# Patient Record
Sex: Female | Born: 1980 | Race: White | Hispanic: No | Smoking: Never smoker
Health system: Southern US, Community
[De-identification: ages and names within clinical notes are randomized; demographics above are authoritative.]

## PROBLEM LIST (undated history)

## (undated) DIAGNOSIS — N3010 Interstitial cystitis (chronic) without hematuria: Secondary | ICD-10-CM

## (undated) DIAGNOSIS — F329 Major depressive disorder, single episode, unspecified: Secondary | ICD-10-CM

## (undated) DIAGNOSIS — G4700 Insomnia, unspecified: Secondary | ICD-10-CM

## (undated) DIAGNOSIS — F32A Depression, unspecified: Secondary | ICD-10-CM

## (undated) HISTORY — DX: Interstitial cystitis (chronic) without hematuria: N30.10

## (undated) HISTORY — DX: Insomnia, unspecified: G47.00

## (undated) HISTORY — DX: Depression, unspecified: F32.A

## (undated) HISTORY — DX: Major depressive disorder, single episode, unspecified: F32.9

---

## 2003-12-30 ENCOUNTER — Inpatient Hospital Stay (HOSPITAL_COMMUNITY): Admission: AD | Admit: 2003-12-30 | Discharge: 2003-12-30 | Payer: Self-pay | Admitting: Family Medicine

## 2004-09-25 ENCOUNTER — Emergency Department (HOSPITAL_COMMUNITY): Admission: EM | Admit: 2004-09-25 | Discharge: 2004-09-25 | Payer: Self-pay | Admitting: Emergency Medicine

## 2005-11-26 ENCOUNTER — Emergency Department (HOSPITAL_COMMUNITY): Admission: EM | Admit: 2005-11-26 | Discharge: 2005-11-26 | Payer: Self-pay | Admitting: Emergency Medicine

## 2006-04-29 ENCOUNTER — Emergency Department (HOSPITAL_COMMUNITY): Admission: EM | Admit: 2006-04-29 | Discharge: 2006-04-29 | Payer: Self-pay | Admitting: Family Medicine

## 2006-05-14 ENCOUNTER — Emergency Department (HOSPITAL_COMMUNITY): Admission: EM | Admit: 2006-05-14 | Discharge: 2006-05-14 | Payer: Self-pay | Admitting: Emergency Medicine

## 2007-01-26 ENCOUNTER — Emergency Department (HOSPITAL_COMMUNITY): Admission: EM | Admit: 2007-01-26 | Discharge: 2007-01-26 | Payer: Self-pay | Admitting: Emergency Medicine

## 2007-05-15 ENCOUNTER — Emergency Department (HOSPITAL_COMMUNITY): Admission: EM | Admit: 2007-05-15 | Discharge: 2007-05-15 | Payer: Self-pay | Admitting: Family Medicine

## 2009-08-31 ENCOUNTER — Ambulatory Visit (HOSPITAL_BASED_OUTPATIENT_CLINIC_OR_DEPARTMENT_OTHER): Admission: RE | Admit: 2009-08-31 | Discharge: 2009-08-31 | Payer: Self-pay

## 2009-09-03 ENCOUNTER — Ambulatory Visit: Payer: Self-pay | Admitting: Internal Medicine

## 2010-10-02 ENCOUNTER — Ambulatory Visit (INDEPENDENT_AMBULATORY_CARE_PROVIDER_SITE_OTHER): Payer: BC Managed Care – PPO | Admitting: Licensed Clinical Social Worker

## 2010-10-02 DIAGNOSIS — F4323 Adjustment disorder with mixed anxiety and depressed mood: Secondary | ICD-10-CM

## 2010-10-17 ENCOUNTER — Ambulatory Visit: Payer: BC Managed Care – PPO | Admitting: Licensed Clinical Social Worker

## 2010-11-21 ENCOUNTER — Ambulatory Visit (INDEPENDENT_AMBULATORY_CARE_PROVIDER_SITE_OTHER): Payer: BC Managed Care – PPO | Admitting: Licensed Clinical Social Worker

## 2010-11-21 DIAGNOSIS — F4323 Adjustment disorder with mixed anxiety and depressed mood: Secondary | ICD-10-CM

## 2010-12-20 ENCOUNTER — Ambulatory Visit: Payer: BC Managed Care – PPO | Admitting: Licensed Clinical Social Worker

## 2011-01-24 ENCOUNTER — Ambulatory Visit (INDEPENDENT_AMBULATORY_CARE_PROVIDER_SITE_OTHER): Payer: BC Managed Care – PPO | Admitting: Family Medicine

## 2011-01-24 ENCOUNTER — Encounter: Payer: Self-pay | Admitting: Family Medicine

## 2011-01-24 VITALS — BP 130/90 | HR 91 | Temp 98.4°F | Ht 64.0 in | Wt 199.5 lb

## 2011-01-24 DIAGNOSIS — F3289 Other specified depressive episodes: Secondary | ICD-10-CM

## 2011-01-24 DIAGNOSIS — N3010 Interstitial cystitis (chronic) without hematuria: Secondary | ICD-10-CM | POA: Insufficient documentation

## 2011-01-24 DIAGNOSIS — G4700 Insomnia, unspecified: Secondary | ICD-10-CM

## 2011-01-24 DIAGNOSIS — Z113 Encounter for screening for infections with a predominantly sexual mode of transmission: Secondary | ICD-10-CM

## 2011-01-24 DIAGNOSIS — G4730 Sleep apnea, unspecified: Secondary | ICD-10-CM | POA: Insufficient documentation

## 2011-01-24 DIAGNOSIS — F32A Depression, unspecified: Secondary | ICD-10-CM | POA: Insufficient documentation

## 2011-01-24 DIAGNOSIS — F329 Major depressive disorder, single episode, unspecified: Secondary | ICD-10-CM | POA: Insufficient documentation

## 2011-01-24 MED ORDER — BUPROPION HCL ER (XL) 150 MG PO TB24: 150.0000 mg | ORAL_TABLET | ORAL | Status: DC

## 2011-01-24 NOTE — Patient Instructions (Signed)
Wonderful to meet you. Please call me in 3-4 weeks to let me know how you're feeling.  IMPORTANT: HOW TO USE THIS INFORMATION:  This is a summary and does NOT have all possible information about this product. This information does not assure that this product is safe, effective, or appropriate for you. This information is not individual medical advice and does not substitute for the advice of your health care professional. Always ask your health care professional for complete information about this product and your specific health needs.    BUPROPION - ORAL ([id number]-on)    COMMON BRAND NAME(S): Wellbutrin    WARNING:  Bupropion is an antidepressant used to treat a variety of conditions, including depression, other mental/mood disorders, and smoking cessation. Antidepressants can help prevent suicidal thoughts/attempts and provide other important benefits. However, studies have shown that a small number of people who take antidepressants for any condition may experience new or worsening depression, other mental/mood symptoms, or suicidal thoughts/attempts. Therefore, it is very important to talk with the doctor about the risks and benefits of antidepressant medication, even if treatment is not for a mental/mood condition. Tell the doctor immediately if you notice new or worsening depression/other psychiatric conditions, unusual behavior changes (including possible suicidal thoughts/attempts), or other mental/mood changes (including new/worsening anxiety, panic attacks, trouble sleeping, irritability, hostile/angry feelings, impulsive actions, severe restlessness, very rapid speech). Be especially watchful for these symptoms when a new antidepressant is started or when the dose is changed. If you are using bupropion to quit smoking, stop taking bupropion and contact your doctor immediately if you experience any of the symptoms listed above or if you have any of these symptoms after stopping treatment.    USES:  Bupropion is used to treat depression. It can improve your mood and feelings of well-being. It may work by helping to restore the balance of certain natural chemicals (neurotransmitters) in your brain.    OTHER USES:  This section contains uses of this drug that are not listed in the approved professional labeling for the drug but that may be prescribed by your health care professional. Use this drug for a condition that is listed in this section only if it has been so prescribed by your health care professional. Bupropion may also be used to treat attention deficit hyperactivity disorder (ADHD), or to help people quit smoking by decreasing cravings and nicotine withdrawal effects. This drug may also be used with other medications to treat bipolar disorder (depressive phase).    HOW TO USE:  Read the Patient Information Leaflet and Medication Guide available from your pharmacist before you start using bupropion and each time you get a refill. Consult your doctor or pharmacist if you have any questions. Take this medication by mouth, with or without food, usually three times daily. If stomach upset occurs, you may take this drug with food. It is important to take your doses at least 6 hours apart or as directed by your doctor to decrease your risk of having a seizure. Do not take more or less medication or take it more frequently than prescribed. Taking more than the recommended dose of bupropion may increase your risk of having a seizure. Do not take more than 150 milligrams as a single dose, and do not take more than 450 milligrams per day. Your dosage is based on your medical condition and response to therapy. Your dose may be slowly increased to limit side effects such as sleeplessness, and to decrease the risk of seizures. To  avoid trouble sleeping, do not take this medication too close to bedtime. Let your doctor know if sleeplessness becomes a problem. Use this medication regularly in order to get  the most benefit from it. To help you remember, use it at the same times each day. Do not stop taking this medication without consulting your doctor. Some conditions may become worse when the drug is suddenly stopped. Your dose may need to be gradually decreased. It may take 4 or more weeks before you notice the full benefit of this drug. Continue to take this medication as directed by your doctor even after you feel better. Talk to your doctor if your condition does not improve or if it worsens.    SIDE EFFECTS:  See also the How to Use, Precautions, and Warning sections. Nausea, vomiting, dry mouth, headache, constipation, increased sweating, joint aches, sore throat, blurred vision, strange taste in the mouth, or dizziness may occur. If any of these effects persist or worsen, notify your doctor or pharmacist promptly. Remember that your doctor has prescribed this medication because he or she has judged that the benefit to you is greater than the risk of side effects. Many people using this medication do not have serious side effects. Tell your doctor immediately if any of these unlikely but serious side effects occur: chest pain, fainting, fast/pounding/irregular heartbeat, hearing problems, ringing in the ears, severe headache, mental/mood changes (e.g., agitation, anxiety, confusion, hallucinations), uncontrolled movements (tremor), unusual weight loss or gain. Tell your doctor immediately if any of these rare but very serious side effects occur: muscle pain/tenderness/weakness, change in the amount of urine. This drug may infrequently cause seizures. Seek immediate medical attention if you experience a seizure. If you have a seizure while taking bupropion, you should not take this drug again. A very serious allergic reaction to this drug is unlikely, but seek immediate medical attention if it occurs. Symptoms of a serious allergic reaction include: rash, itching/swelling (especially of the  face/tongue/throat), severe dizziness, trouble breathing. This is not a complete list of possible side effects. If you notice other effects not listed above, contact your doctor or pharmacist. In the [place] - Call your doctor for medical advice about side effects. You may report side effects to FDA at 1-800-FDA-1088. In [place] - Call your doctor for medical advice about side effects. You may report side effects to [facility] [place] at [phone].    PRECAUTIONS:  See also the How to Use and Warning sections. Before taking bupropion, tell your doctor or pharmacist if you are allergic to it; or if you have any other allergies. This product may contain inactive ingredients, which can cause allergic reactions or other problems. Talk to your pharmacist for more details. This medication should not be used if you have certain medical conditions. Before using this medicine, consult your doctor or pharmacist if you have or have had: seizures, eating disorders (e.g., bulimia, anorexia nervosa). This medication should not be used if you are suddenly stopping regular use of sedatives (e.g., benzodiazepines such as lorazepam) or alcohol. Doing so may increase your risk of seizures. Large amounts of alcohol may also increase your risk of seizures and dizziness. Discuss your use of these products with your doctor. Before using this medication, tell your doctor or pharmacist your medical history, especially of: alcohol/drug dependence (including benzodiazepines, narcotic pain medicines, cocaine and stimulants), brain tumor, diabetes, head injury, heart disease (e.g., congestive heart failure, high blood pressure, recent heart attack), kidney problems, liver problems (e.g., cirrhosis), personal  or family history of psychiatric disorder (e.g., bipolar/manic-depressive disorder), personal or family history of suicide thoughts/attempts, intent to quit smoking. Though uncommon, depression can lead to thoughts or attempts of suicide. Tell  your doctor immediately if you have any suicidal thoughts, worsening depression, or any other mental/mood changes (including new or worsening anxiety, agitation, panic attacks, trouble sleeping, irritability, hostile/angry feelings, impulsive actions, severe restlessness, rapid speech, unusual behavior changes). Keep all medical appointments so your doctor can monitor your progress closely and adjust or change your medication if needed. This drug may make you dizzy or affect your coordination. Do not drive, use machinery, or do any activity that requires alertness until you are sure you can perform such activities safely. Avoid or limit alcoholic beverages. Kidney function declines as you grow older. This medication is removed by the kidneys. Therefore, elderly people may be more sensitive to this drug and to side effects. This medication should be used only when clearly needed during pregnancy. Discuss the risks and benefits with your doctor. Infrequently, newborns whose mothers have used certain newer antidepressants during the last 3 months of pregnancy may develop symptoms including persistent feeding or breathing difficulties, jitteriness, seizures or constant crying. Promptly report any such symptoms to the doctor. However, do not stop taking this medication unless your doctor directs you to do so. This drug passes into breast milk and may have undesirable effects on a nursing infant. Consult your doctor before breast-feeding.    DRUG INTERACTIONS:  The effects of some drugs can change if you take other drugs or herbal products at the same time. This can increase your risk for serious side effects or may cause your medications not to work correctly. These drug interactions are possible, but do not always occur. Your doctor or pharmacist can often prevent or manage interactions by changing how you use your medications or by close monitoring. To help your doctor and pharmacist give you the best care, be sure  to tell your doctor and pharmacist about all the products you use (including prescription drugs, nonprescription drugs, and herbal products) before starting treatment with this product. While using this product, do not start, stop, or change the dosage of any other medicines you are using without your doctor's approval. Some products that may interact with this drug include: amantadine, diabetes medications (such as glyburide, glipizide, or insulin), certain x-ray dyes (including iomeprol), levodopa, nicotine products (such as patches, gum, or spray), regular use of sedatives (such as alprazolam), stimulants, warfarin. Avoid taking MAO inhibitors (isocarboxazid, linezolid, moclobemide, phenelzine, procarbazine, rasagiline, selegiline, tranylcypromine) during treatment with this medication and for two weeks before and after treatment. In some cases a serious (possibly fatal) drug interaction may occur. Other medications can affect the removal of bupropion from your body, which may affect how bupropion works. Examples include cyclophosphamide, orphenadrine, thiotepa, antiplatelet drugs (including clopidogrel, ticlodipine), anti-seizure drugs (such as carbamazepine, phenobarbital, phenytoin), HIV drugs (such as ritonavir), rifamycins (such as rifampin), among others. Bupropion can speed up the removal of other drugs from your body, which may affect how they work. Examples of affected drugs include citalopram, antiarrhythmics (such as propafenone, flecainide), antidepressants (such as desipramine, paroxetine, fluoxetine, sertraline), antipsychotics (such as haloperidol, thioridazine), beta-blockers (such as metoprolol), among others. Also report the use of drugs which might increase seizure risk (decrease seizure threshold) when combined with bupropion, such as antipsychotics (e.g., chlorpromazine), tricyclic antidepressants (e.g., amitriptyline), corticosteroids (e.g., prednisone) or theophylline, among others. Consult  your doctor or pharmacist for details. Large amounts of caffeine  and other stimulants, such as those found in weight loss and cold/sinus medications, can increase the chance of seizures with this drug. Check all nonprescription/prescription/herbal drug labels for caffeine and other stimulants (e.g., ephedra). Consult your doctor or pharmacist. This document does not contain all possible drug interactions. Keep a list of all the products you use. Share this list with your doctor and pharmacist to lessen your risk for serious medication problems.    OVERDOSE:  If overdose is suspected, contact your local poison control center or emergency room immediately. [place] residents can call the [place] national poison hotline at [phone]. [place] residents should call their local poison control center directly. Symptoms of overdose may include: seizures, hallucinations, fast or slow heart rate, loss of consciousness.    NOTES:  Do not share this medication with others. Psychiatric/medical checkups or tests such as blood pressure monitoring may be performed periodically to monitor your progress or check for side effects. Consult your doctor for more details.    MISSED DOSE:  If you miss a dose, skip the missed dose and resume your usual dosing schedule. Do not double the dose to catch up.    STORAGE:  Store at room temperature between 59-77 degrees F (15-25 degrees C) away from light and moisture. Bupropion tablets may have a strange odor. This is normal and the medication is still okay to use. Do not store in the bathroom. Keep all medicines away from children and pets. Do not flush medications down the toilet or pour them into a drain unless instructed to do so. Properly discard this product when it is expired or no longer needed. Consult your pharmacist or local waste disposal company for more details about how to safely discard your product.    Information last revised October 2010 Copyright(c) 2010 [employer]  [employer].

## 2011-01-24 NOTE — Progress Notes (Signed)
Subjective:    Patient ID: Anna Burke, female    DOB: Jul 12, 1980, 30 y.o.   MRN: [id number]  HPI  30 yo here to establish care.  IC- diagnosed in 2006 along with Chronic fatigue syndrome by Dr. Marcelyn Bruins at [facility]. Has been doing quite well on Elmiron 1000 mg 2-4 times daily.  Was taking Ritalin for Chronic Fatigue Syndrome but has not taken it in months.  Depression- several traumatic events occurred in 2011.  Multiple deaths in family and friends. Lost her job in 2009 and has still not been able to find good employment.  Felt suicidal earlier this year so started seeing Judithe Modest at [facility]. No longer having suicidal thoughts but still feels depressed. Darl Pikes felt she needed to establish care to consider starting an antidepressant. Was started on a TCA last year for IC and gained 50 pounds.  Does not want to restart a TCA.  Denies panic attacks or any current SI or HI.  Has baseline issues with insomnia, has not been worsened by her depression.  Patient Active Problem List  Diagnoses  . Insomnia  . Depression  . Interstitial cystitis   Past Medical History  Diagnosis Date  . Insomnia     Sleep study on 09/03/2009 by Dr. Maple Hudson consistent with insomnia but no organic sleep disturbance identified.  . Depression   . Interstitial cystitis     Dr. Logan Bores [facility]   No past surgical history on file. History  Substance Use Topics  . Smoking status: Never Smoker   . Smokeless tobacco: Not on file  . Alcohol Use: Not on file   Family History  Problem Relation Age of Onset  . Arthritis Mother   . Arthritis Father    Allergies  Allergen Reactions  . Sulfa Antibiotics Hives  . Ortho Evra (Norelgestromin-Eth Estradiol) Rash and Other (See Comments)    Elevated BP  . Shellfish Allergy Nausea And Vomiting   Current outpatient prescriptions:diazepam (VALIUM) 5 MG tablet, Take one to two tablets by mouth daily , Disp: , Rfl: ;  hydrOXYzine  (ATARAX/VISTARIL) 25 MG tablet, Take one to three tablets by mouth daily , Disp: , Rfl: ;  methylphenidate (RITALIN) 20 MG tablet, Take 20 mg by mouth 2 (two) times daily.  , Disp: , Rfl: ;  Misc Natural Products (COLOSTRUM) 500 MG CAPS, Take 1 capsule by mouth 2 (two) times daily.  , Disp: , Rfl:  NON FORMULARY, [facility] 900mg  daily , Disp: , Rfl: ;  norethindrone-ethinyl estradiol (JUNEL FE,GILDESS FE,LOESTRIN FE) 1-20 MG-MCG tablet, Take 1 tablet by mouth daily.  , Disp: , Rfl: ;  pentosan polysulfate (ELMIRON) 100 MG capsule, Take two to four tablets by mouth daily , Disp: , Rfl: ;  buPROPion (WELLBUTRIN XL) 150 MG 24 hr tablet, Take 1 tablet (150 mg total) by mouth every morning., Disp: 30 tablet, Rfl: 2  The PMH, PSH, Social History, Family History, Medications, and allergies have been reviewed in [facility], and have been updated if relevant.  Review of Systems See HPI Patient reports no  vision/ hearing changes,anorexia, fever ,adenopathy, persistant / recurrent hoarseness, swallowing issues, chest pain, edema,persistant / recurrent cough, hemoptysis, dyspnea(rest, exertional, paroxysmal nocturnal), gastrointestinal  bleeding (melena, rectal bleeding), abdominal pain, excessive heart burn, GU symptoms(dysuria, hematuria, pyuria, voiding/incontinence  Issues) syncope, focal weakness, severe memory loss, concerning skin lesions,  abnormal bruising/bleeding, major joint swelling, breast masses or abnormal vaginal bleeding.       Objective:  Physical Exam BP 130/90  Pulse 91  Temp(Src) 98.4 F (36.9 C) (Oral)  Ht 5\' 4"  (1.626 m)  Wt 199 lb 8 oz (90.493 kg)  BMI 34.24 kg/m2  LMP 01/10/2011  General:  Well-developed,well-nourished,in no acute distress; alert,appropriate and cooperative throughout examination Head:  normocephalic and atraumatic.   Eyes:  vision grossly intact, pupils equal, pupils round, and pupils reactive to light.   Ears:  R ear normal and L ear normal.   Nose:  no  external deformity.   Mouth:  good dentition.   Neck:  No deformities, masses, or tenderness noted. Lungs:  Normal respiratory effort, chest expands symmetrically. Lungs are clear to auscultation, no crackles or wheezes. Heart:  Normal rate and regular rhythm. S1 and S2 normal without gallop, murmur, click, rub or other extra sounds. Abdomen:  Bowel sounds positive,abdomen soft and non-tender without masses, organomegaly or hernias noted. Msk:  No deformity or scoliosis noted of thoracic or lumbar spine.   Extremities:  No clubbing, cyanosis, edema, or deformity noted with normal full range of motion of all joints.   Neurologic:  alert & oriented X3 and gait normal.   Skin:  Intact without suspicious lesions or rashes Psych:  Cognition and judgment appear intact. Alert and cooperative with normal attention span and concentration. No apparent delusions, illusions, hallucinations     Assessment & Plan:   1. Insomnia   Stable.  Advised to not take Wellbutrin late in day. The patient indicates understanding of these issues and agrees with the plan.    2. Depression  >30 min spent with face to face with patient, >50% counseling and/or coordinating care. Will start Wellbutrin 150 mg xl daily and continue psychotherapy.  Pt to follow up in one month. The patient indicates understanding of these issues and agrees with the plan.       3. Interstitial cystitis   Stable, followed by urology.

## 2011-01-25 LAB — HIV ANTIBODY (ROUTINE TESTING W REFLEX): HIV: NONREACTIVE

## 2011-01-25 LAB — RPR

## 2011-02-01 ENCOUNTER — Other Ambulatory Visit: Payer: Self-pay | Admitting: Radiology

## 2011-02-01 DIAGNOSIS — N3010 Interstitial cystitis (chronic) without hematuria: Secondary | ICD-10-CM

## 2011-02-01 DIAGNOSIS — Z113 Encounter for screening for infections with a predominantly sexual mode of transmission: Secondary | ICD-10-CM

## 2011-02-02 LAB — GC/CHLAMYDIA PROBE AMP, URINE
Chlamydia, Swab/Urine, PCR: NEGATIVE
GC Probe Amp, Urine: NEGATIVE

## 2011-02-07 ENCOUNTER — Encounter: Payer: Self-pay | Admitting: *Deleted

## 2011-02-14 ENCOUNTER — Telehealth: Payer: Self-pay | Admitting: *Deleted

## 2011-02-14 NOTE — Telephone Encounter (Signed)
Patient advised via telephone that GC/Chlamydia results were negative.

## 2011-03-22 ENCOUNTER — Ambulatory Visit (INDEPENDENT_AMBULATORY_CARE_PROVIDER_SITE_OTHER): Payer: BC Managed Care – PPO | Admitting: Family Medicine

## 2011-03-22 ENCOUNTER — Encounter: Payer: Self-pay | Admitting: Family Medicine

## 2011-03-22 DIAGNOSIS — F329 Major depressive disorder, single episode, unspecified: Secondary | ICD-10-CM

## 2011-03-22 DIAGNOSIS — F3289 Other specified depressive episodes: Secondary | ICD-10-CM

## 2011-03-22 DIAGNOSIS — F32A Depression, unspecified: Secondary | ICD-10-CM

## 2011-03-22 MED ORDER — METHYLPHENIDATE HCL 20 MG PO TABS: 20.0000 mg | ORAL_TABLET | Freq: Two times a day (BID) | ORAL | Status: DC

## 2011-03-22 MED ORDER — CYCLOBENZAPRINE HCL 5 MG PO TABS: 5.0000 mg | ORAL_TABLET | Freq: Three times a day (TID) | ORAL | Status: DC | PRN

## 2011-03-22 MED ORDER — BUPROPION HCL ER (XL) 300 MG PO TB24: 300.0000 mg | ORAL_TABLET | Freq: Every day | ORAL | Status: DC

## 2011-03-22 NOTE — Patient Instructions (Signed)
Good to see you. Let me know if you develop any tingling or worsening soreness. You can take the muscle relaxant (flexeril) up to three times daily (it does make you sleepy). Have a great holiday.
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
## 2011-04-04 ENCOUNTER — Encounter: Payer: Self-pay | Admitting: Family Medicine

## 2011-04-04 ENCOUNTER — Ambulatory Visit (INDEPENDENT_AMBULATORY_CARE_PROVIDER_SITE_OTHER): Payer: BC Managed Care – PPO | Admitting: Family Medicine

## 2011-04-04 VITALS — BP 136/94 | HR 80 | Temp 98.1°F | Wt 200.8 lb

## 2011-04-04 DIAGNOSIS — R109 Unspecified abdominal pain: Secondary | ICD-10-CM

## 2011-04-04 DIAGNOSIS — M25559 Pain in unspecified hip: Secondary | ICD-10-CM

## 2011-04-04 LAB — POCT URINALYSIS DIPSTICK
Blood, UA: NEGATIVE
Glucose, UA: NEGATIVE
Ketones, UA: NEGATIVE
Protein, UA: NEGATIVE
Spec Grav, UA: 1.025
pH, UA: 6

## 2011-04-04 MED ORDER — CYCLOBENZAPRINE HCL 5 MG PO TABS: 5.0000 mg | ORAL_TABLET | Freq: Three times a day (TID) | ORAL | Status: DC | PRN

## 2011-04-04 NOTE — Patient Instructions (Signed)
Take Ibuprofen 200mg  4 tabs three times a day after meals. Take Flexeril 5mg  three times a day. Use heat and ice aggressively through the upcoming weekend and then taper. If no improvement , return. Use heat and ice as discussed, aggressively to start.
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
## 2011-06-15 ENCOUNTER — Ambulatory Visit (INDEPENDENT_AMBULATORY_CARE_PROVIDER_SITE_OTHER): Payer: BC Managed Care – PPO | Admitting: Family Medicine

## 2011-06-15 ENCOUNTER — Encounter: Payer: Self-pay | Admitting: Family Medicine

## 2011-06-15 VITALS — BP 136/88 | HR 96 | Temp 98.1°F | Wt 185.0 lb

## 2011-06-15 DIAGNOSIS — J329 Chronic sinusitis, unspecified: Secondary | ICD-10-CM

## 2011-06-15 MED ORDER — HYDROCOD POLST-CHLORPHEN POLST 10-8 MG/5ML PO LQCR: 5.0000 mL | Freq: Every evening | ORAL | Status: DC | PRN

## 2011-06-15 MED ORDER — METHYLPHENIDATE HCL 20 MG PO TABS: 20.0000 mg | ORAL_TABLET | Freq: Two times a day (BID) | ORAL | Status: DC

## 2011-06-15 MED ORDER — AMOXICILLIN-POT CLAVULANATE 875-125 MG PO TABS: 1.0000 | ORAL_TABLET | Freq: Two times a day (BID) | ORAL | Status: AC
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
## 2011-06-15 NOTE — Patient Instructions (Signed)
Take Augmentin as directed as directed- 1 tab twice daily x 10 days.  Drink lots of fluids.  Treat sympotmatically with Mucinex, nasal saline irrigation, and Tylenol/Ibuprofen.  You can use warm compresses.  Cough suppressant (Delsym) at night. Call if not improving as expected in 5-7 days.

## 2011-09-11 ENCOUNTER — Other Ambulatory Visit: Payer: Self-pay

## 2011-09-11 MED ORDER — METHYLPHENIDATE HCL 20 MG PO TABS: 20.0000 mg | ORAL_TABLET | Freq: Two times a day (BID) | ORAL | Status: DC

## 2011-09-11 MED ORDER — NORETHIN ACE-ETH ESTRAD-FE 1-20 MG-MCG PO TABS: 1.0000 | ORAL_TABLET | Freq: Every day | ORAL | Status: DC

## 2011-09-11 NOTE — Telephone Encounter (Signed)
Rx refilled.

## 2011-09-11 NOTE — Telephone Encounter (Signed)
Pt left v/m ritalin is taper only would like written rx. Pt changing to new OB/GYN; was taking junel birth control; request refill; not on med list. Call when ready for pick up.

## 2011-09-11 NOTE — Telephone Encounter (Signed)
Addended by: Dianne Dun on: 09/11/2011 12:07 PM   Modules accepted: Orders

## 2011-09-11 NOTE — Telephone Encounter (Signed)
Pt is also requesting refill on junel, which isn't on med list.

## 2011-09-20 ENCOUNTER — Other Ambulatory Visit: Payer: Self-pay

## 2011-09-20 ENCOUNTER — Emergency Department (HOSPITAL_COMMUNITY): Payer: BC Managed Care – PPO

## 2011-09-20 ENCOUNTER — Emergency Department (HOSPITAL_COMMUNITY)
Admission: EM | Admit: 2011-09-20 | Discharge: 2011-09-20 | Disposition: A | Discharge status: Home / Self Care | Payer: BC Managed Care – PPO | Attending: Emergency Medicine | Admitting: Emergency Medicine

## 2011-09-20 ENCOUNTER — Encounter (HOSPITAL_COMMUNITY): Payer: Self-pay | Admitting: Cardiology

## 2011-09-20 DIAGNOSIS — R079 Chest pain, unspecified: Secondary | ICD-10-CM | POA: Insufficient documentation

## 2011-09-20 DIAGNOSIS — F3289 Other specified depressive episodes: Secondary | ICD-10-CM | POA: Insufficient documentation

## 2011-09-20 DIAGNOSIS — F329 Major depressive disorder, single episode, unspecified: Secondary | ICD-10-CM | POA: Insufficient documentation

## 2011-09-20 LAB — POCT I-STAT, CHEM 8
Chloride: 105 mEq/L (ref 96–112)
Glucose, Bld: 87 mg/dL (ref 70–99)
Hemoglobin: 12.9 g/dL (ref 12.0–15.0)
Potassium: 4 mEq/L (ref 3.5–5.1)

## 2011-09-20 LAB — POCT I-STAT TROPONIN I: Troponin i, poc: 0 ng/mL (ref 0.00–0.08)

## 2011-09-20 LAB — DIFFERENTIAL
Lymphocytes Relative: 49 % — ABNORMAL HIGH (ref 12–46)
Lymphs Abs: 4.1 10*3/uL — ABNORMAL HIGH (ref 0.7–4.0)
Neutro Abs: 3.5 10*3/uL (ref 1.7–7.7)

## 2011-09-20 LAB — CBC
Hemoglobin: 12.5 g/dL (ref 12.0–15.0)
MCH: 30 pg (ref 26.0–34.0)
WBC: 8.3 10*3/uL (ref 4.0–10.5)

## 2011-09-20 MED ORDER — GI COCKTAIL ~~LOC~~
30.0000 mL | Freq: Once | ORAL | Status: AC
  Administered 2011-09-20: 30 mL via ORAL
  Filled 2011-09-20: qty 30

## 2011-09-20 NOTE — ED Notes (Signed)
Pt c/o generalized chest pain that started this morning around 0700. Denies any n/v or diaphoresis with the pain. Denies any cardiac hx. Reports that she was told to come here by PCP. No distress noted at this time.

## 2011-09-20 NOTE — ED Notes (Signed)
Pt got changed into a hospital and was attached to the monitor

## 2011-09-20 NOTE — Discharge Instructions (Signed)
Follow up with your primary care provider for recheck of recurrent chest discomfort but return to ER for emergent changing or worsening of symptoms.

## 2011-09-20 NOTE — ED Provider Notes (Signed)
History     CSN: [id number]  Arrival date & time 09/20/11  0901   First MD Initiated Contact with Patient 09/20/11 [id number]      Chief Complaint  Patient presents with  . Chest Pain    (Consider location/radiation/quality/duration/timing/severity/associated sxs/prior treatment) HPI  Patient presents to ER complaining of acute onset pressure or "band like" sensation of pressure around lower chest "bra line" that began at 7 am this morning and has been constant and unchanging since onset. Patient states she felt a little light headed earlier. Denies hx of similar pain and therefore called her PCP and was instructed to come to ER. Denies associated fevers, chills, SOB, n/v, diaphoresis, cough, hemoptysis, lower extremity pain or swelling, abdominal pain, dysuria or hematuria. She took nothing for symptoms PTA. Denies tobacco, illicit drug use or alcohol use. She denies exogenous estrogen use.   Past Medical History  Diagnosis Date  . Insomnia     Sleep study on 09/03/2009 by Dr. Maple Hudson consistent with insomnia but no organic sleep disturbance identified.  . Depression   . Interstitial cystitis     Dr. Logan Bores [facility]    History reviewed. No pertinent past surgical history.  Family History  Problem Relation Age of Onset  . Arthritis Mother   . Arthritis Father     History  Substance Use Topics  . Smoking status: Never Smoker   . Smokeless tobacco: Not on file  . Alcohol Use: Not on file    OB History    Grav Para Term Preterm Abortions TAB SAB Ect Mult Living                  Review of Systems  All other systems reviewed and are negative.    Allergies  Sulfa antibiotics; Ortho evra; and Shellfish allergy  Home Medications   Current Outpatient Rx  Name Route Sig Dispense Refill  . BUPROPION HCL ER (XL) 300 MG PO TB24 Oral Take 300 mg by mouth daily.    . COLOSTRUM PO Oral Take 2 tablets by mouth daily.    Marland Kitchen DIAZEPAM 5 MG PO TABS Oral Take 5-10 mg by mouth daily as  needed. For anxiety.    Marland Kitchen HYDROXYZINE HCL 25 MG PO TABS Oral Take 25 mg by mouth 3 (three) times daily as needed. For nerves.    . METHYLPHENIDATE HCL 20 MG PO TABS Oral Take 20 mg by mouth 2 (two) times daily.    Azzie Roup ACE-ETH ESTRAD-FE 1-20 MG-MCG PO TABS Oral Take 1 tablet by mouth daily. 1 Package 11    BP 143/76  Pulse 100  Temp 98.4 F (36.9 C) (Oral)  Resp 18  SpO2 100%  Physical Exam  Nursing note and vitals reviewed. Constitutional: She is oriented to person, place, and time. She appears well-developed and well-nourished. No distress.  HENT:  Head: Normocephalic and atraumatic.  Eyes: Conjunctivae are normal.  Neck: Normal range of motion. Neck supple.  Cardiovascular: Normal rate, regular rhythm, normal heart sounds and intact distal pulses.  Exam reveals no gallop and no friction rub.   No murmur heard. Pulmonary/Chest: Effort normal and breath sounds normal. No respiratory distress. She has no wheezes. She has no rales. She exhibits no tenderness.  Abdominal: Soft. Bowel sounds are normal. She exhibits no distension and no mass. There is no tenderness. There is no rebound and no guarding.  Musculoskeletal: Normal range of motion. She exhibits no edema and no tenderness.  Neurological: She is alert and oriented  to person, place, and time.  Skin: Skin is warm and dry. No rash noted. She is not diaphoretic. No erythema.  Psychiatric: She has a normal mood and affect.    ED Course  Procedures (including critical care time)   Date: 09/20/2011  Rate: 96  Rhythm: normal sinus rhythm  QRS Axis: normal  Intervals: normal  ST/T Wave abnormalities: normal  Conduction Disutrbances: none  Narrative Interpretation: non provocative EKG  Old EKG Reviewed: none for comparison   Labs Reviewed - No data to display Dg Chest 2 View  09/20/2011  *RADIOLOGY REPORT*  Clinical Data: Chest pressure and tightness.  CHEST - 2 VIEW  Comparison: Chest 04/29/2006.  Findings: Lungs are  clear.  Heart size is normal.  No pneumothorax or effusion.  IMPRESSION: Negative chest.  Original Report Authenticated By: Bernadene Bell. D'ALESSIO, M.D.     1. Chest pain       MDM  Afebrile, non toxic appearing. Chest and abdomen completely non tender. Low risk for both ACS and PE and PERC negative. 2+ hours of constant unchanging pressure with non provocative EKG and no elevation in troponin.         [place], [place] 09/20/11 1045

## 2011-09-22 NOTE — ED Provider Notes (Signed)
Medical screening examination/treatment/procedure(s) were performed by non-physician practitioner and as supervising physician I was immediately available for consultation/collaboration.   Carleene Cooper III, MD 09/22/11 1210

## 2011-10-20 ENCOUNTER — Other Ambulatory Visit: Payer: Self-pay | Admitting: Family Medicine

## 2011-11-05 ENCOUNTER — Ambulatory Visit: Payer: BC Managed Care – PPO | Admitting: Family Medicine

## 2011-11-12 ENCOUNTER — Other Ambulatory Visit: Payer: Self-pay

## 2011-11-12 NOTE — Telephone Encounter (Signed)
Pt left v/m requesting Ritalin rx. Call when ready for pick up; husband will pick up rx.

## 2011-11-13 MED ORDER — METHYLPHENIDATE HCL 20 MG PO TABS: 20.0000 mg | ORAL_TABLET | Freq: Two times a day (BID) | ORAL | Status: DC

## 2011-11-13 NOTE — Telephone Encounter (Signed)
Left message advising pt script is ready.

## 2011-11-16 ENCOUNTER — Telehealth: Payer: Self-pay

## 2011-11-16 NOTE — Telephone Encounter (Signed)
pts husband lost Ritalin rx 2 days ago before getting it to [facility]. Mr Ander Slade request another rx. I explained is controlled rx, but he asked me to request from Dr Dayton Martes.Please advise.

## 2011-11-16 NOTE — Telephone Encounter (Signed)
My policy is not to refil lost ADD meds - will route to Dr Dayton Martes also

## 2011-11-19 NOTE — Telephone Encounter (Signed)
Advised patient's husband, aplogized that it couldn't be done.

## 2011-11-19 NOTE — Telephone Encounter (Signed)
We cannot refill lost or stolen controlled substances.

## 2012-01-25 ENCOUNTER — Other Ambulatory Visit: Payer: Self-pay

## 2012-01-25 MED ORDER — METHYLPHENIDATE HCL 20 MG PO TABS: 20.0000 mg | ORAL_TABLET | Freq: Two times a day (BID) | ORAL | Status: DC

## 2012-01-25 NOTE — Telephone Encounter (Signed)
Pt requesting rx Ritalin.call  When ready for pick up.

## 2012-01-25 NOTE — Telephone Encounter (Signed)
Left  Message advising patient script is ready for pick up.

## 2012-03-03 ENCOUNTER — Other Ambulatory Visit: Payer: Self-pay

## 2012-03-03 MED ORDER — HYDROCOD POLST-CHLORPHEN POLST 10-8 MG/5ML PO LQCR: 5.0000 mL | Freq: Every evening | ORAL | Status: DC | PRN

## 2012-03-03 NOTE — Telephone Encounter (Signed)
Pt left v/m requesting refill on tussionex. Pt had cough for 3 weeks. Works for [employer] and hard to schedule appt before Thanksgiving. I called pt to get more info and got v/m; pt to call back.Please advise.

## 2012-03-04 NOTE — Telephone Encounter (Signed)
Medicine called to [facility]. 

## 2012-03-04 NOTE — Telephone Encounter (Signed)
Pt left v/m wanted more detailed message of what Dr Dayton Martes advised. Left v/m that med was called to [facility] on [address](spoke with Jacki Cones and was called to [facility] not [facility]). If cough persists needs to make appt to be seen.

## 2012-03-25 ENCOUNTER — Ambulatory Visit: Payer: BC Managed Care – PPO | Admitting: Family Medicine

## 2012-03-26 ENCOUNTER — Encounter: Payer: Self-pay | Admitting: Family Medicine

## 2012-03-26 ENCOUNTER — Ambulatory Visit: Payer: BC Managed Care – PPO | Admitting: Family Medicine

## 2012-03-26 ENCOUNTER — Ambulatory Visit (INDEPENDENT_AMBULATORY_CARE_PROVIDER_SITE_OTHER): Payer: BC Managed Care – PPO | Admitting: Family Medicine

## 2012-03-26 VITALS — BP 120/82 | HR 82 | Temp 98.1°F | Wt 182.0 lb

## 2012-03-26 DIAGNOSIS — R059 Cough, unspecified: Secondary | ICD-10-CM

## 2012-03-26 DIAGNOSIS — R05 Cough: Secondary | ICD-10-CM

## 2012-03-26 MED ORDER — METHYLPHENIDATE HCL 20 MG PO TABS: 20.0000 mg | ORAL_TABLET | Freq: Two times a day (BID) | ORAL | Status: DC

## 2012-03-26 MED ORDER — CEFUROXIME AXETIL 500 MG PO TABS: 500.0000 mg | ORAL_TABLET | Freq: Two times a day (BID) | ORAL | Status: AC

## 2012-03-26 MED ORDER — HYDROCOD POLST-CHLORPHEN POLST 10-8 MG/5ML PO LQCR: 5.0000 mL | Freq: Every evening | ORAL | Status: DC | PRN
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
## 2012-03-26 NOTE — Patient Instructions (Addendum)
Take antibiotic as directed- Ceftin 500 mg twice daily x 10 days.  Drink lots of fluids.  Treat sympotmatically with Mucinex, nasal saline irrigation, and Tylenol/Ibuprofen. Also try claritin D or zyrtec D over the counter- two times a day as needed ( have to sign for them at pharmacy). You can use warm compresses.  Cough suppressant at night. Call if not improving as expected in 5-7 days.

## 2012-04-14 ENCOUNTER — Other Ambulatory Visit: Payer: Self-pay | Admitting: Family Medicine

## 2012-04-15 ENCOUNTER — Telehealth: Payer: Self-pay

## 2012-04-15 NOTE — Telephone Encounter (Signed)
Pt has finished antibiotic and is somewhat better but still has non productive cough when eats or lays down. Pt scheduled appt 04/16/12 at 10:15 am.

## 2012-04-16 ENCOUNTER — Ambulatory Visit (INDEPENDENT_AMBULATORY_CARE_PROVIDER_SITE_OTHER): Payer: BC Managed Care – PPO | Admitting: Family Medicine

## 2012-04-16 ENCOUNTER — Encounter: Payer: Self-pay | Admitting: Family Medicine

## 2012-04-16 VITALS — BP 140/90 | HR 82 | Temp 98.3°F | Wt 176.0 lb

## 2012-04-16 DIAGNOSIS — J069 Acute upper respiratory infection, unspecified: Secondary | ICD-10-CM

## 2012-04-16 MED ORDER — BENZONATATE 200 MG PO CAPS: 200.0000 mg | ORAL_CAPSULE | Freq: Two times a day (BID) | ORAL | Status: DC | PRN

## 2012-04-16 MED ORDER — HYDROCOD POLST-CHLORPHEN POLST 10-8 MG/5ML PO LQCR: 5.0000 mL | Freq: Every evening | ORAL | Status: DC | PRN

## 2012-04-16 NOTE — Patient Instructions (Addendum)
Good to see you.  Your lungs sounded great today. We are adding Tessalon to your Tussionex. Call me with an update.
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
## 2012-05-16 ENCOUNTER — Other Ambulatory Visit: Payer: Self-pay | Admitting: *Deleted

## 2012-05-16 MED ORDER — BUPROPION HCL ER (XL) 300 MG PO TB24: 300.0000 mg | ORAL_TABLET | Freq: Every day | ORAL | Status: DC

## 2012-07-09 ENCOUNTER — Other Ambulatory Visit: Payer: Self-pay | Admitting: Family Medicine

## 2012-07-09 NOTE — Telephone Encounter (Signed)
Pt requests refill on her microgestin. Only visits in the last few years have been for acute problems.  Please advise.

## 2012-07-29 ENCOUNTER — Other Ambulatory Visit: Payer: Self-pay | Admitting: *Deleted

## 2012-07-29 MED ORDER — NORETHIN ACE-ETH ESTRAD-FE 1-20 MG-MCG PO TABS: ORAL_TABLET | ORAL | Status: DC

## 2012-07-29 NOTE — Telephone Encounter (Signed)
Faxed refill request from [facility], pt has only been seen for acute issues in the past 1+ year.

## 2012-08-13 ENCOUNTER — Other Ambulatory Visit: Payer: Self-pay

## 2012-08-13 NOTE — Telephone Encounter (Signed)
Pt left v/m requesting rx ritalin. Call when ready for pick up. 

## 2012-08-13 NOTE — Telephone Encounter (Signed)
Ok to print out and leave in my box for signature. 

## 2012-08-14 MED ORDER — METHYLPHENIDATE HCL 20 MG PO TABS: 20.0000 mg | ORAL_TABLET | Freq: Two times a day (BID) | ORAL | Status: DC

## 2012-08-14 NOTE — Telephone Encounter (Signed)
Left message advising patient script is ready for pick up. 

## 2012-08-25 ENCOUNTER — Other Ambulatory Visit: Payer: Self-pay | Admitting: Family Medicine

## 2012-08-25 NOTE — Telephone Encounter (Signed)
Pt has not been seen for a physical and has no upcoming appts scheduled.  Please advise on refill.

## 2012-09-15 ENCOUNTER — Other Ambulatory Visit: Payer: Self-pay | Admitting: Family Medicine

## 2012-09-16 ENCOUNTER — Other Ambulatory Visit: Payer: Self-pay | Admitting: *Deleted

## 2012-09-16 MED ORDER — BUPROPION HCL ER (XL) 300 MG PO TB24: 300.0000 mg | ORAL_TABLET | Freq: Every day | ORAL | Status: DC

## 2012-10-10 ENCOUNTER — Other Ambulatory Visit: Payer: Self-pay | Admitting: Family Medicine

## 2012-10-14 ENCOUNTER — Other Ambulatory Visit: Payer: Self-pay | Admitting: Family Medicine

## 2012-10-17 ENCOUNTER — Other Ambulatory Visit: Payer: Self-pay

## 2012-10-17 MED ORDER — METHYLPHENIDATE HCL 20 MG PO TABS: 20.0000 mg | ORAL_TABLET | Freq: Two times a day (BID) | ORAL | Status: DC

## 2012-10-17 NOTE — Telephone Encounter (Signed)
Pt request rx methylphenidate. Call when ready for pickup.

## 2012-10-20 ENCOUNTER — Encounter: Payer: Self-pay | Admitting: Family Medicine

## 2012-10-20 NOTE — Telephone Encounter (Signed)
Left message advising patient script is ready for pick up. 

## 2012-10-31 ENCOUNTER — Encounter: Payer: Self-pay | Admitting: Family Medicine

## 2012-11-07 ENCOUNTER — Other Ambulatory Visit: Payer: Self-pay | Admitting: Family Medicine

## 2012-11-11 ENCOUNTER — Other Ambulatory Visit: Payer: Self-pay | Admitting: Family Medicine

## 2012-11-30 ENCOUNTER — Other Ambulatory Visit: Payer: Self-pay | Admitting: Family Medicine

## 2012-12-16 ENCOUNTER — Other Ambulatory Visit: Payer: Self-pay | Admitting: Family Medicine

## 2012-12-16 NOTE — Telephone Encounter (Signed)
Pt has scheduled appt for 10/8 to renew meds.  I advised her she needs to keep this appt in order to continue to receive refills.  Pt agreed.

## 2012-12-22 ENCOUNTER — Other Ambulatory Visit: Payer: Self-pay | Admitting: Family Medicine

## 2013-01-06 ENCOUNTER — Encounter: Payer: Self-pay | Admitting: Family Medicine

## 2013-01-06 ENCOUNTER — Ambulatory Visit (INDEPENDENT_AMBULATORY_CARE_PROVIDER_SITE_OTHER): Payer: BC Managed Care – PPO | Admitting: Family Medicine

## 2013-01-06 VITALS — BP 118/84 | HR 104 | Temp 98.2°F | Wt 186.0 lb

## 2013-01-06 DIAGNOSIS — F3289 Other specified depressive episodes: Secondary | ICD-10-CM

## 2013-01-06 DIAGNOSIS — F32A Depression, unspecified: Secondary | ICD-10-CM

## 2013-01-06 DIAGNOSIS — R5382 Chronic fatigue, unspecified: Secondary | ICD-10-CM | POA: Insufficient documentation

## 2013-01-06 DIAGNOSIS — R5381 Other malaise: Secondary | ICD-10-CM

## 2013-01-06 DIAGNOSIS — N3010 Interstitial cystitis (chronic) without hematuria: Secondary | ICD-10-CM

## 2013-01-06 DIAGNOSIS — F329 Major depressive disorder, single episode, unspecified: Secondary | ICD-10-CM

## 2013-01-06 DIAGNOSIS — G4700 Insomnia, unspecified: Secondary | ICD-10-CM

## 2013-01-06 MED ORDER — NORETHIN ACE-ETH ESTRAD-FE 1-20 MG-MCG PO TABS: ORAL_TABLET | ORAL | Status: DC

## 2013-01-06 MED ORDER — METHYLPHENIDATE HCL 20 MG PO TABS: 20.0000 mg | ORAL_TABLET | Freq: Two times a day (BID) | ORAL | Status: DC

## 2013-01-06 MED ORDER — BUPROPION HCL ER (XL) 300 MG PO TB24: ORAL_TABLET | ORAL | Status: DC

## 2013-01-06 NOTE — Progress Notes (Signed)
  Subjective:    Patient ID: Anna Burke, female    DOB: 03-06-1981, 32 y.o.   MRN: [id number]  HPI  32 yo pleasant female here for follow up.  Depression- has been stable on Wellbutrin 300 mg XL daily.  Has been busy at work.  Went on an [place] cruise this summer. Denies any symptoms of anxiety or depression.  Chronic fatigue- has been stable on as needed Ritalin.  Has not refilled it since 10/2012.  IC- has been working on narrowing down her triggers.  Food triggers have been the biggest issue.  She has had a couple of more flares recently but nothing that "wasn't manageable."  Patient Active Problem List   Diagnosis Date Noted  . Chronic fatigue 01/06/2013  . Insomnia 01/24/2011  . Depression   . Interstitial cystitis    Past Medical History  Diagnosis Date  . Insomnia     Sleep study on 09/03/2009 by Dr. Maple Hudson consistent with insomnia but no organic sleep disturbance identified.  . Depression   . Interstitial cystitis     Dr. Logan Bores [facility]   No past surgical history on file. History  Substance Use Topics  . Smoking status: Never Smoker   . Smokeless tobacco: Not on file  . Alcohol Use: Not on file   Family History  Problem Relation Age of Onset  . Arthritis Mother   . Arthritis Father    Allergies  Allergen Reactions  . Sulfa Antibiotics Hives  . Ortho Evra [Norelgestromin-Eth Estradiol] Rash and Other (See Comments)    Elevated BP  . Shellfish Allergy Nausea And Vomiting   Current Outpatient Prescriptions on File Prior to Visit  Medication Sig Dispense Refill  . COLOSTRUM PO Take 2 tablets by mouth daily.      . diazepam (VALIUM) 5 MG tablet Take 5-10 mg by mouth daily as needed. For anxiety.      . hydrOXYzine (ATARAX/VISTARIL) 25 MG tablet Take 25 mg by mouth 3 (three) times daily as needed. For nerves.       No current facility-administered medications on file prior to visit.   The PMH, PSH, Social History, Family History, Medications, and  allergies have been reviewed in [facility], and have been updated if relevant.     Review of Systems    See HPI Appetite good Wt Readings from Last 3 Encounters:  01/06/13 186 lb (84.369 kg)  04/16/12 176 lb (79.833 kg)  03/26/12 182 lb (82.555 kg)    Objective:   Physical Exam BP 118/84  Pulse 104  Temp(Src) 98.2 F (36.8 C) (Oral)  Wt 186 lb (84.369 kg)  BMI 31.91 kg/m2  SpO2 97%  LMP 11/17/2012 Gen:  Alert, NAD Psych:  Good eye contact, not anxious or depressed appearing    Assessment & Plan:  1. Depression Stable on current dose of Wellbutrin. Rx refilled.  2. Insomnia Per pt, has actually been sleeping ok.  3. Chronic fatigue Continue prn Ritalin.   4. Interstitial cystitis Deteriorated.  Continue supportive tx.

## 2013-01-06 NOTE — Patient Instructions (Addendum)
Good to see you. Call to make an appointment in flu shot clinic.

## 2013-01-13 ENCOUNTER — Other Ambulatory Visit: Payer: Self-pay | Admitting: Family Medicine

## 2013-01-14 ENCOUNTER — Ambulatory Visit: Payer: BC Managed Care – PPO | Admitting: Family Medicine

## 2013-01-26 ENCOUNTER — Other Ambulatory Visit: Payer: Self-pay | Admitting: *Deleted

## 2013-01-26 NOTE — Telephone Encounter (Signed)
Faxed refill request. Please advise.  

## 2013-01-27 MED ORDER — HYDROXYZINE HCL 25 MG PO TABS: 25.0000 mg | ORAL_TABLET | Freq: Three times a day (TID) | ORAL | Status: DC | PRN

## 2013-02-19 ENCOUNTER — Ambulatory Visit (INDEPENDENT_AMBULATORY_CARE_PROVIDER_SITE_OTHER): Payer: BC Managed Care – PPO | Admitting: Family Medicine

## 2013-02-19 ENCOUNTER — Encounter: Payer: Self-pay | Admitting: Family Medicine

## 2013-02-19 VITALS — BP 120/90 | HR 105 | Temp 98.3°F | Ht 63.25 in | Wt 193.2 lb

## 2013-02-19 DIAGNOSIS — L659 Nonscarring hair loss, unspecified: Secondary | ICD-10-CM

## 2013-02-19 DIAGNOSIS — R635 Abnormal weight gain: Secondary | ICD-10-CM

## 2013-02-19 MED ORDER — METHYLPHENIDATE HCL 20 MG PO TABS: 20.0000 mg | ORAL_TABLET | Freq: Two times a day (BID) | ORAL | Status: DC

## 2013-02-19 NOTE — Progress Notes (Signed)
Pre-visit discussion using our clinic review tool. No additional management support is needed unless otherwise documented below in the visit note.  

## 2013-02-19 NOTE — Progress Notes (Signed)
Date:  02/19/2013   Name:  Anna Burke   DOB:  1980-09-22   MRN:  [id number] Gender: female Age: 32 y.o.  Primary Physician:  Ruthe Mannan, MD   Chief Complaint: Weight Gain and Alopecia   Subjective:   History of Present Illness:  Anna Burke is a 32 y.o. very pleasant female patient who presents with the following:  Has gained about ten pounds in the last few weeks. Hair has been shedding some. Mom has a history of thyroid disease.  Little toe also off and on numb.  Dry face  Takes OCP's 3 months straight, then withdrawal.   Past Medical History, Surgical History, Social History, Family History, Problem List, Medications, and Allergies have been reviewed and updated if relevant.  Review of Systems:  GEN: No acute illnesses, no fevers, chills. GI: No n/v/d, eating normally Pulm: No SOB Interactive and getting along well at home.  Otherwise, ROS is as per the HPI.  Objective:   Physical Examination: BP 120/90  Pulse 105  Temp(Src) 98.3 F (36.8 C) (Oral)  Ht 5' 3.25" (1.607 m)  Wt 193 lb 4 oz (87.658 kg)  BMI 33.94 kg/m2  LMP 02/16/2013   GEN: WDWN, NAD, Non-toxic, A & O x 3 HEENT: Atraumatic, Normocephalic. Neck supple. No masses, No LAD. Ears and Nose: No external deformity. CV: RRR, No M/G/R. No JVD. No thrill. No extra heart sounds. PULM: CTA B, no wheezes, crackles, rhonchi. No retractions. No resp. distress. No accessory muscle use. EXTR: No c/c/e NEURO Normal gait.  PSYCH: Normally interactive. Conversant. Not depressed or anxious appearing.  Calm demeanor.   Recent Results (from the past 2160 hour(s))  FSH/LH     Status: None   Collection Time    02/19/13  6:47 PM      Result Value Range   FSH 4.6     Comment: Reference Ranges:              Female:                         1.4 -  18.1 mIU/mL              Female:   Follicular Phase    2.5 -  10.2 mIU/mL                        MidCycle Peak       3.4 -  33.4 mIU/mL            Luteal Phase        1.5 -   9.1 mIU/mL                        Post Menopausal    23.0 - 116.3 mIU/mL                        Pregnant                <   0.3 mIU/mL   LH 1.1     Comment: Reference Ranges:              Female:     20 - 70 Years           1.5 -  9.3 mIU/mL                           >  70 Years           3.1 - 34.6 mIU/mL              Female:   Follicular Phase        1.9 - 12.5 mIU/mL                        Midcycle                8.7 - 76.3 mIU/mL                        Luteal Phase            0.5 - 16.9 mIU/mL                        Post Menopausal        15.9 - 54.0 mIU/mL                        Pregnant                    <  1.5 mIU/mL                        Contraceptives          0.7 -  5.6 mIU/mL              Children:                             <  6.0 mIU/mL          Assessment & Plan:    Weight gain - Plan: T3, free, T4, free, TSH, FSH/LH  Hair loss  Basic labs work-up No pcos  There are no Patient Instructions on file for this visit.  Orders Today:  Orders Placed This Encounter  Procedures  . T3, free  . T4, free  . TSH  . FSH/LH    New medications, updates to list, dose adjustments: Meds ordered this encounter  Medications  . hydrOXYzine (ATARAX/VISTARIL) 25 MG tablet    Sig: Take 25 mg by mouth 3 (three) times daily as needed.  . methylphenidate (RITALIN) 20 MG tablet    Sig: Take 1 tablet (20 mg total) by mouth 2 (two) times daily.    Dispense:  60 tablet    Refill:  0    Signed,  Glendola Friedhoff T. Jerlean Peralta, MD, CAQ Sports Medicine  [facility] [address] [place] [address] Phone: [phone] Fax: [phone]  Updated Complete Medication List:   Medication List       This list is accurate as of: 02/19/13 11:59 PM.  Always use your most recent med list.               buPROPion 300 MG 24 hr tablet  Commonly known as:  WELLBUTRIN XL  TAKE 1 TABLET BY MOUTH DAILY     COLOSTRUM PO  Take 2  tablets by mouth daily.     diazepam 5 MG tablet  Commonly known as:  VALIUM  Take 5-10 mg by mouth daily as needed.     hydrOXYzine 25 MG tablet  Commonly known as:  ATARAX/VISTARIL  Take 25 mg by mouth 3 (three) times daily as needed.  methylphenidate 20 MG tablet  Commonly known as:  RITALIN  Take 1 tablet (20 mg total) by mouth 2 (two) times daily.     norethindrone-ethinyl estradiol 1-20 MG-MCG tablet  Commonly known as:  MICROGESTIN FE 1/20  TAKE 1 TABLET BY MOUTH EVERY DAY

## 2013-02-20 LAB — TSH: TSH: 1.86 u[IU]/mL (ref 0.35–5.50)

## 2013-02-20 LAB — T4, FREE: Free T4: 0.7 ng/dL (ref 0.60–1.60)

## 2013-02-20 LAB — FSH/LH: LH: 1.1 m[IU]/mL

## 2013-02-23 ENCOUNTER — Encounter: Payer: Self-pay | Admitting: *Deleted

## 2013-02-27 ENCOUNTER — Telehealth: Payer: Self-pay

## 2013-02-27 NOTE — Telephone Encounter (Signed)
Pt left v/m requesting lab results from 02/19/13. Patient notified as instructed by telephone v/m.

## 2013-04-14 ENCOUNTER — Other Ambulatory Visit: Payer: Self-pay

## 2013-04-14 MED ORDER — HYDROXYZINE HCL 25 MG PO TABS: 25.0000 mg | ORAL_TABLET | Freq: Three times a day (TID) | ORAL | Status: DC | PRN

## 2013-04-14 NOTE — Telephone Encounter (Signed)
Rx last filled 02/03/13 and Last office visit was with Dr Dallas Schimkeopeland on 02/19/13 and last office visit with you was 01/06/13--please advise

## 2013-04-17 ENCOUNTER — Encounter: Payer: Self-pay | Admitting: Physician Assistant

## 2013-04-17 ENCOUNTER — Telehealth: Payer: Self-pay | Admitting: Family Medicine

## 2013-04-17 ENCOUNTER — Ambulatory Visit (INDEPENDENT_AMBULATORY_CARE_PROVIDER_SITE_OTHER): Payer: BC Managed Care – PPO | Admitting: Physician Assistant

## 2013-04-17 VITALS — BP 128/88 | HR 89 | Temp 98.4°F | Resp 16 | Ht 63.25 in | Wt 188.5 lb

## 2013-04-17 DIAGNOSIS — K5900 Constipation, unspecified: Secondary | ICD-10-CM

## 2013-04-17 LAB — CBC WITH DIFFERENTIAL/PLATELET
BASOS PCT: 0 % (ref 0–1)
Basophils Absolute: 0 10*3/uL (ref 0.0–0.1)
EOS ABS: 0.1 10*3/uL (ref 0.0–0.7)
EOS PCT: 1 % (ref 0–5)
HEMATOCRIT: 40.3 % (ref 36.0–46.0)
HEMOGLOBIN: 13.5 g/dL (ref 12.0–15.0)
Lymphocytes Relative: 27 % (ref 12–46)
Lymphs Abs: 2.9 10*3/uL (ref 0.7–4.0)
MCH: 29.9 pg (ref 26.0–34.0)
MCHC: 33.5 g/dL (ref 30.0–36.0)
MCV: 89.2 fL (ref 78.0–100.0)
MONO ABS: 0.6 10*3/uL (ref 0.1–1.0)
MONOS PCT: 6 % (ref 3–12)
Neutro Abs: 7.1 10*3/uL (ref 1.7–7.7)
Neutrophils Relative %: 66 % (ref 43–77)
Platelets: 378 10*3/uL (ref 150–400)
RBC: 4.52 MIL/uL (ref 3.87–5.11)
RDW: 13.4 % (ref 11.5–15.5)
WBC: 10.6 10*3/uL — ABNORMAL HIGH (ref 4.0–10.5)

## 2013-04-17 NOTE — Progress Notes (Signed)
Pre visit review using our clinic review tool, if applicable. No additional management support is needed unless otherwise documented below in the visit note/SLS  

## 2013-04-17 NOTE — Patient Instructions (Signed)
Increase fluid intake.  Fiber supplement and probiotic.  Take a stool softener.  Use a fleets enema.  If symptoms not improving, you may need to be seen in the ER for enema and disimpaction.

## 2013-04-17 NOTE — Progress Notes (Signed)
Patient ID: Anna Burke, female   DOB: 13-Oct-1980, 33 y.o.   MRN: [id number]  Patient presents to clinic today c/o constipation x 2-3 days.  Patient endorses no bowel movement in the past few days.  Has noted a sensation of rectal fullness.  Patient states she took a stool softener 24 hours ago and has still not been able to have a bowel movement.  Denies abdominal pain, N/V.  Denies fever, chills, malaise or fatigue.  Patient states she tried to manually remove sore from her rectum.  Endorses rectal pain and bright red blood after her attempt. Patient states bleeding has resolved.  Past Medical History  Diagnosis Date  . Insomnia     Sleep study on 09/03/2009 by Dr. Maple Hudson consistent with insomnia but no organic sleep disturbance identified.  . Depression   . Interstitial cystitis     Dr. Logan Bores [facility]    Current Outpatient Prescriptions on File Prior to Visit  Medication Sig Dispense Refill  . buPROPion (WELLBUTRIN XL) 300 MG 24 hr tablet TAKE 1 TABLET BY MOUTH DAILY  30 tablet  6  . COLOSTRUM PO Take 2 tablets by mouth daily.      . diazepam (VALIUM) 5 MG tablet Take 5-10 mg by mouth daily as needed.       . hydrOXYzine (ATARAX/VISTARIL) 25 MG tablet Take 1 tablet (25 mg total) by mouth 3 (three) times daily as needed.  30 tablet  0  . methylphenidate (RITALIN) 20 MG tablet Take 1 tablet (20 mg total) by mouth 2 (two) times daily.  60 tablet  0  . norethindrone-ethinyl estradiol (MICROGESTIN FE 1/20) 1-20 MG-MCG tablet TAKE 1 TABLET BY MOUTH EVERY DAY  28 tablet  11   No current facility-administered medications on file prior to visit.    Allergies  Allergen Reactions  . Sulfa Antibiotics Hives  . Ortho Evra [Norelgestromin-Eth Estradiol] Rash and Other (See Comments)    Elevated BP  . Shellfish Allergy Nausea And Vomiting    Family History  Problem Relation Age of Onset  . Arthritis Mother   . Arthritis Father     History   Social History  . Marital Status: Married    Spouse Name: N/A    Number of Children: 0  . Years of Education: N/A   Occupational History  . umemployed    Social History Main Topics  . Smoking status: Never Smoker   . Smokeless tobacco: Never Used  . Alcohol Use: Yes  . Drug Use: No  . Sexual Activity: None   Other Topics Concern  . None   Social History Narrative  . None   Review of Systems - See HPI.  All other ROS are negative.  Filed Vitals:   04/17/13 1424  BP: 128/88  Pulse: 89  Temp: 98.4 F (36.9 C)  Resp: 16   Physical Exam  Vitals reviewed. Constitutional: She is oriented to person, place, and time and well-developed, well-nourished, and in no distress.  HENT:  Head: Normocephalic and atraumatic.  Eyes: Conjunctivae and EOM are normal. Pupils are equal, round, and reactive to light.  Neck: Neck supple.  Cardiovascular: Normal rate, regular rhythm, normal heart sounds and intact distal pulses.   Pulmonary/Chest: Effort normal and breath sounds normal. No respiratory distress. She has no wheezes. She has no rales. She exhibits no tenderness.  Abdominal: Soft. Bowel sounds are normal. She exhibits no distension and no mass. There is no tenderness. There is no rebound and no guarding.  Genitourinary: Rectal exam shows no external hemorrhoid, no internal hemorrhoid, no fissure and no laceration. Guaiac positive stool.  Hard stool felt in the colon. Broken up manually. Direct blood noted on examination glove, most likely due to patient's attempt at manual removal of stool earlier today.  Lymphadenopathy:    She has no cervical adenopathy.  Neurological: She is alert and oriented to person, place, and time.  Skin: Skin is warm and dry. No rash noted.  Psychiatric: Affect normal.    Recent Results (from the past 2160 hour(s))  T3, FREE     Status: None   Collection Time    02/19/13  6:47 PM      Result Value Range   T3, Free 2.6  2.3 - 4.2 pg/mL  T4, FREE     Status: None   Collection Time    02/19/13   6:47 PM      Result Value Range   Free T4 0.70  0.60 - 1.60 ng/dL  TSH     Status: None   Collection Time    02/19/13  6:47 PM      Result Value Range   TSH 1.86  0.35 - 5.50 uIU/mL  FSH/LH     Status: None   Collection Time    02/19/13  6:47 PM      Result Value Range   FSH 4.6     Comment: Reference Ranges:              Female:                         1.4 -  18.1 mIU/mL              Female:   Follicular Phase    2.5 -  10.2 mIU/mL                        MidCycle Peak       3.4 -  33.4 mIU/mL                        Luteal Phase        1.5 -   9.1 mIU/mL                        Post Menopausal    23.0 - 116.3 mIU/mL                        Pregnant                <   0.3 mIU/mL   LH 1.1     Comment: Reference Ranges:              Female:     20 - 70 Years           1.5 -  9.3 mIU/mL                           > 70 Years           3.1 - 34.6 mIU/mL              Female:   Follicular Phase        1.9 - 12.5 mIU/mL  Midcycle                8.7 - 76.3 mIU/mL                        Luteal Phase            0.5 - 16.9 mIU/mL                        Post Menopausal        15.9 - 54.0 mIU/mL                        Pregnant                    <  1.5 mIU/mL                        Contraceptives          0.7 -  5.6 mIU/mL              Children:                             <  6.0 mIU/mL          Assessment/Plan: No problem-specific assessment & plan notes found for this encounter.

## 2013-04-17 NOTE — Telephone Encounter (Signed)
Patient Information:  Caller Name: Anna Burke  Phone: [phone]  Patient: Anna Burke, Anna Burke  Gender: Female  DOB: 23-Apr-1980  Age: 33 Years  PCP: Ruthe MannanAron, [facility](Family Practice)  Pregnant: No  Office Follow Up:  Does the office need to follow up with this patient?: No  Instructions For The Office: N/A   Symptoms  Reason For Call & Symptoms: Pt has not had a bm in several days. Her Mom had a severe heart attack [place] Last night she took Colace x 1 thinking that would help. She feels a hard stool in her rectum that she was trying to "pick out" with a glove. During that she had rectal bleeding.  Reviewed Health History In EMR: Yes  Reviewed Medications In EMR: Yes  Reviewed Allergies In EMR: Yes  Reviewed Surgeries / Procedures: Yes  Date of Onset of Symptoms: 04/17/2013 OB / GYN:  LMP: 03/31/2013  Guideline(s) Used:  Rectal Bleeding  Disposition Per Guideline:   See Today in Office  Reason For Disposition Reached:   Blood passed alone without any stool  Advice Given:  N/A  Patient Will Follow Care Advice:  YES  Appointment Scheduled:  04/17/2013 14:00:00 Appointment Scheduled Provider:  Other. [facility]. Pt [facility]

## 2013-04-19 DIAGNOSIS — K5900 Constipation, unspecified: Secondary | ICD-10-CM | POA: Insufficient documentation

## 2013-04-19 NOTE — Assessment & Plan Note (Signed)
Hard stool felt in the colon. Manually disimpacted. Increase fluid intake. Probiotic and fiber supplement. Stool softener, prune juice and Fleets enema. Bowel sounds mildly hypoactive. Will obtain x-ray of abdomen. Will obtain CBC.  We'll obtain urine pregnancy test prior to imaging. Will proceed if negative.

## 2013-04-22 ENCOUNTER — Encounter: Payer: Self-pay | Admitting: *Deleted

## 2013-05-16 ENCOUNTER — Other Ambulatory Visit: Payer: Self-pay | Admitting: Family Medicine

## 2013-05-16 ENCOUNTER — Emergency Department (HOSPITAL_COMMUNITY)
Admission: EM | Admit: 2013-05-16 | Discharge: 2013-05-16 | Disposition: A | Discharge status: Home / Self Care | Payer: BC Managed Care – PPO | Attending: Emergency Medicine | Admitting: Emergency Medicine

## 2013-05-16 ENCOUNTER — Emergency Department (HOSPITAL_COMMUNITY): Payer: BC Managed Care – PPO

## 2013-05-16 ENCOUNTER — Encounter (HOSPITAL_COMMUNITY): Payer: Self-pay | Admitting: Emergency Medicine

## 2013-05-16 DIAGNOSIS — N3010 Interstitial cystitis (chronic) without hematuria: Secondary | ICD-10-CM | POA: Insufficient documentation

## 2013-05-16 DIAGNOSIS — Z888 Allergy status to other drugs, medicaments and biological substances status: Secondary | ICD-10-CM | POA: Insufficient documentation

## 2013-05-16 DIAGNOSIS — G4700 Insomnia, unspecified: Secondary | ICD-10-CM | POA: Insufficient documentation

## 2013-05-16 DIAGNOSIS — F3289 Other specified depressive episodes: Secondary | ICD-10-CM | POA: Insufficient documentation

## 2013-05-16 DIAGNOSIS — Z882 Allergy status to sulfonamides status: Secondary | ICD-10-CM | POA: Insufficient documentation

## 2013-05-16 DIAGNOSIS — Z79899 Other long term (current) drug therapy: Secondary | ICD-10-CM | POA: Insufficient documentation

## 2013-05-16 DIAGNOSIS — K219 Gastro-esophageal reflux disease without esophagitis: Secondary | ICD-10-CM

## 2013-05-16 DIAGNOSIS — F329 Major depressive disorder, single episode, unspecified: Secondary | ICD-10-CM | POA: Insufficient documentation

## 2013-05-16 LAB — HEPATIC FUNCTION PANEL
ALBUMIN: 4 g/dL (ref 3.5–5.2)
ALK PHOS: 122 U/L — AB (ref 39–117)
ALT: 15 U/L (ref 0–35)
AST: 17 U/L (ref 0–37)
Bilirubin, Direct: <0.2 mg/dL (ref 0.0–0.3)
Total Bilirubin: <0.2 mg/dL — ABNORMAL LOW (ref 0.3–1.2)
Total Protein: 8.2 g/dL (ref 6.0–8.3)

## 2013-05-16 LAB — CBC
HEMATOCRIT: 40 % (ref 36.0–46.0)
Hemoglobin: 14 g/dL (ref 12.0–15.0)
MCH: 30.8 pg (ref 26.0–34.0)
MCHC: 35 g/dL (ref 30.0–36.0)
MCV: 87.9 fL (ref 78.0–100.0)
Platelets: 397 10*3/uL (ref 150–400)
RBC: 4.55 MIL/uL (ref 3.87–5.11)
RDW: 13.2 % (ref 11.5–15.5)
WBC: 10.9 10*3/uL — ABNORMAL HIGH (ref 4.0–10.5)

## 2013-05-16 LAB — BASIC METABOLIC PANEL
BUN: 12 mg/dL (ref 6–23)
CALCIUM: 9.2 mg/dL (ref 8.4–10.5)
CO2: 22 mEq/L (ref 19–32)
Chloride: 100 mEq/L (ref 96–112)
Creatinine, Ser: 0.85 mg/dL (ref 0.50–1.10)
GFR calc Af Amer: >90 mL/min (ref >90)
GFR, EST NON AFRICAN AMERICAN: 90 mL/min — AB (ref >90)
GLUCOSE: 87 mg/dL (ref 70–99)
Potassium: 3.7 mEq/L (ref 3.7–5.3)
Sodium: 138 mEq/L (ref 137–147)

## 2013-05-16 LAB — PRO B NATRIURETIC PEPTIDE: Pro B Natriuretic peptide (BNP): 10.1 pg/mL (ref 0–125)

## 2013-05-16 LAB — LIPASE, BLOOD: Lipase: 49 U/L (ref 11–59)

## 2013-05-16 LAB — POCT I-STAT TROPONIN I: Troponin i, poc: 0 ng/mL (ref 0.00–0.08)

## 2013-05-16 MED ORDER — ESOMEPRAZOLE MAGNESIUM 40 MG PO CPDR: 40.0000 mg | DELAYED_RELEASE_CAPSULE | Freq: Every day | ORAL | Status: DC

## 2013-05-16 MED ORDER — GI COCKTAIL ~~LOC~~
30.0000 mL | Freq: Once | ORAL | Status: AC
  Administered 2013-05-16: 30 mL via ORAL
  Filled 2013-05-16: qty 30

## 2013-05-16 NOTE — ED Provider Notes (Signed)
CSN: [id number]     Arrival date & time 05/16/13  0210 History   First MD Initiated Contact with Patient 05/16/13 0221     Chief Complaint  Patient presents with  . Chest Pain   (Consider location/radiation/quality/duration/timing/severity/associated sxs/prior Treatment) HPI This patient is a64 year old woman with history of interstitial cystitis and depression. She presents with complaints of burning, centrally located chest discomfort which is mild. It began while she was at rest and has persisted. Her symptoms have been quite on for about 12 hours. Nothing seems to make her symptoms worse or better. Symptoms wax and wane in severity. At the time of initial exam, the patient described her symptoms as mild.  She has not noticed any change in symptoms with by mouth intake. Her appetite has been normal. She denies associated nausea, vomiting, cough and shortness of breath. Denies history of similar symptoms.  The patient notes that her mother died unexpectedly after suffering an MI following a minor foot surgery last month. Patient was tearful when telling me about the death of her mother and said that she was afraid, based on her mother's untimely death and MI, that she may be suffering from ACS. Patient denies cocaine use.   Past Medical History  Diagnosis Date  . Insomnia     Sleep study on 09/03/2009 by Dr. Maple Hudson consistent with insomnia but no organic sleep disturbance identified.  . Depression   . Interstitial cystitis     Dr. Logan Bores [facility]   History reviewed. No pertinent past surgical history. Family History  Problem Relation Age of Onset  . Arthritis Mother   . Arthritis Father    History  Substance Use Topics  . Smoking status: Never Smoker   . Smokeless tobacco: Never Used  . Alcohol Use: Yes   OB History   Grav Para Term Preterm Abortions TAB SAB Ect Mult Living                 Review of Systems Ten point review of symptoms performed and is negative with the exception  of symptoms noted above.   Allergies  Sulfa antibiotics; Ortho evra; and Shellfish allergy  Home Medications   Current Outpatient Rx  Name  Route  Sig  Dispense  Refill  . buPROPion (WELLBUTRIN XL) 300 MG 24 hr tablet      TAKE 1 TABLET BY MOUTH DAILY   30 tablet   6   . COLOSTRUM PO   Oral   Take 2 tablets by mouth daily.         . hydrOXYzine (ATARAX/VISTARIL) 25 MG tablet   Oral   Take 25 mg by mouth 2 (two) times daily. Painful bladder syndrome         . methylphenidate (RITALIN) 20 MG tablet   Oral   Take 1 tablet (20 mg total) by mouth 2 (two) times daily.   60 tablet   0   . diazepam (VALIUM) 5 MG tablet   Oral   Take 5-10 mg by mouth daily as needed for muscle spasms (bladder nerve pain).           BP 119/69  Pulse 82  Temp(Src) 98.2 F (36.8 C) (Oral)  Resp 18  Ht 5\' 4"  (1.626 m)  Wt 188 lb 8 oz (85.503 kg)  BMI 32.34 kg/m2  SpO2 100%  LMP 05/16/2013 Physical Exam Gen: well developed and well nourished appearing Head: NCAT Eyes: PERL, EOMI Nose: no epistaixis or rhinorrhea Mouth/throat: mucosa is moist and  pink Neck: supple, no stridor Lungs: CTA B, no wheezing, rhonchi or rales CV: RRR, no murmur, extremities appear well perfused.  Abd: soft, notender, nondistended Back: no ttp, no cva ttp Skin: warm and dry Ext: normal to inspection, no dependent edema Neuro: CN ii-xii grossly intact, no focal deficits Psyche; normal affect,  calm and cooperative.   ED Course  Procedures (including critical care time) Labs Review  Results for orders placed during the hospital encounter of 05/16/13 (from the past 24 hour(s))  CBC     Status: Abnormal   Collection Time    05/16/13  2:25 AM      Result Value Range   WBC 10.9 (*) 4.0 - 10.5 K/uL   RBC 4.55  3.87 - 5.11 MIL/uL   Hemoglobin 14.0  12.0 - 15.0 g/dL   HCT 62.140.0  30.836.0 - 65.746.0 %   MCV 87.9  78.0 - 100.0 fL   MCH 30.8  26.0 - 34.0 pg   MCHC 35.0  30.0 - 36.0 g/dL   RDW 84.613.2  96.211.5 - 95.215.5 %    Platelets 397  150 - 400 K/uL  BASIC METABOLIC PANEL     Status: Abnormal   Collection Time    05/16/13  2:25 AM      Result Value Range   Sodium 138  137 - 147 mEq/L   Potassium 3.7  3.7 - 5.3 mEq/L   Chloride 100  96 - 112 mEq/L   CO2 22  19 - 32 mEq/L   Glucose, Bld 87  70 - 99 mg/dL   BUN 12  6 - 23 mg/dL   Creatinine, Ser 8.410.85  0.50 - 1.10 mg/dL   Calcium 9.2  8.4 - 32.410.5 mg/dL   GFR calc non Af Amer 90 (*) >90 mL/min   GFR calc Af Amer >90  >90 mL/min  PRO B NATRIURETIC PEPTIDE     Status: None   Collection Time    05/16/13  2:25 AM      Result Value Range   Pro B Natriuretic peptide (BNP) 10.1  0 - 125 pg/mL  POCT I-STAT TROPONIN I     Status: None   Collection Time    05/16/13  2:29 AM      Result Value Range   Troponin i, poc 0.00  0.00 - 0.08 ng/mL   Comment 3           LIPASE, BLOOD     Status: None   Collection Time    05/16/13  4:12 AM      Result Value Range   Lipase 49  11 - 59 U/L  HEPATIC FUNCTION PANEL     Status: Abnormal   Collection Time    05/16/13  4:12 AM      Result Value Range   Total Protein 8.2  6.0 - 8.3 g/dL   Albumin 4.0  3.5 - 5.2 g/dL   AST 17  0 - 37 U/L   ALT 15  0 - 35 U/L   Alkaline Phosphatase 122 (*) 39 - 117 U/L   Total Bilirubin <0.2 (*) 0.3 - 1.2 mg/dL   Bilirubin, Direct <4.0<0.2  0.0 - 0.3 mg/dL   Indirect Bilirubin NOT CALCULATED  0.3 - 0.9 mg/dL     Imaging Review Dg Chest 2 View  05/16/2013   CLINICAL DATA:  Chest pain  EXAM: CHEST  2 VIEW  COMPARISON:  Prior radiograph from 09/20/2011  FINDINGS: The cardiac and mediastinal silhouettes are  stable in size and contour, and remain within normal limits.  The lungs are normally inflated. No airspace consolidation, pleural effusion, or pulmonary edema is identified. There is no pneumothorax.  No acute osseous abnormality identified.  IMPRESSION: No active cardiopulmonary disease.   Electronically Signed   By: Rise Mu M.D.   On: 05/16/2013 03:26   EKG: nsr, no acute  ischemic changes, normal intervals, normal axis, normal qrs complex  MDM  DDX: ACS, pneumothorax, pneumonia, pericardial or pleural effusion, gastritis, GERD/PUD, musculoskeletal pain, biliary colic, cholecystitis.   Patient is feeling better after tx with GI cocktail. EKG and CXR are wnl. Labs notable only for slightly elevated WBC at 10.9K. I suspect GERD as cause of the patient's burning chest discomfort. We will start on PPI. Patient agrees to f/u with her PCP next week and to return to the ED for red flag sx.     Brandt Loosen, MD 05/16/13 0530

## 2013-05-16 NOTE — ED Notes (Signed)
C/o constant burning pressure across chest with sob and dizziness since 5pm.  Denies nausea/vomiting.

## 2013-05-16 NOTE — ED Notes (Signed)
Bedside report given to Traer CellarJohn Muncy, RN

## 2013-05-16 NOTE — ED Notes (Signed)
Patient transported to X-ray 

## 2013-05-18 NOTE — Telephone Encounter (Signed)
Pt requesting medication refill. Medication on Hx list only. Last ov 01/2013 with no future appts scheduled. pls advise

## 2013-05-21 ENCOUNTER — Other Ambulatory Visit: Payer: Self-pay

## 2013-05-21 ENCOUNTER — Other Ambulatory Visit: Payer: Self-pay | Admitting: *Deleted

## 2013-05-21 MED ORDER — METHYLPHENIDATE HCL 20 MG PO TABS: 20.0000 mg | ORAL_TABLET | Freq: Two times a day (BID) | ORAL | Status: DC

## 2013-05-21 NOTE — Telephone Encounter (Signed)
Lm on pts vm informing her Rx is available for pickup at the front desk; informed a gov't issued photo id required for pickup 

## 2013-05-21 NOTE — Telephone Encounter (Signed)
Pt left v/m requesting methylphenidate. Call when ready for pick up.

## 2013-05-22 ENCOUNTER — Encounter: Payer: Self-pay | Admitting: Family Medicine

## 2013-06-16 ENCOUNTER — Other Ambulatory Visit: Payer: Self-pay | Admitting: Family Medicine

## 2013-06-19 ENCOUNTER — Encounter: Payer: Self-pay | Admitting: Family Medicine

## 2013-07-09 ENCOUNTER — Other Ambulatory Visit: Payer: Self-pay | Admitting: Family Medicine

## 2013-07-16 ENCOUNTER — Telehealth: Payer: Self-pay

## 2013-07-16 ENCOUNTER — Other Ambulatory Visit: Payer: Self-pay | Admitting: Family Medicine

## 2013-07-16 MED ORDER — BENZONATATE 200 MG PO CAPS: 200.0000 mg | ORAL_CAPSULE | Freq: Two times a day (BID) | ORAL | Status: DC | PRN

## 2013-07-16 NOTE — Telephone Encounter (Signed)
Ritalin

## 2013-07-16 NOTE — Telephone Encounter (Signed)
Spoke to pt and informed her Rx has been sent to requested pharmacy. Pt requesting medication refill. Last ov 12/2012 with no future appts scheduled. pls advise

## 2013-07-16 NOTE — Telephone Encounter (Signed)
Pt left v/m; pt requesting cough med [facility]; pt has cough and is not sure if has gone into bronchitis but pt is going out of town and wanted to know if could get med called in; if not will have to schedule appt when she returns. Unable to contact pt by phone.Please advise.

## 2013-07-16 NOTE — Telephone Encounter (Signed)
[facility] [address]

## 2013-07-16 NOTE — Telephone Encounter (Signed)
Which medication is she requesting?

## 2013-07-17 MED ORDER — METHYLPHENIDATE HCL 20 MG PO TABS: 20.0000 mg | ORAL_TABLET | Freq: Two times a day (BID) | ORAL | Status: DC

## 2013-07-17 NOTE — Telephone Encounter (Signed)
Lm on pts vm informing her Rx will be available for pickup after 1300. Must first be signed by Dr Dayton MartesAron; pt informed on vm that ov is required for any additional refills.

## 2013-07-17 NOTE — Telephone Encounter (Signed)
Ok to refill one time only. 

## 2013-07-24 ENCOUNTER — Telehealth: Payer: Self-pay

## 2013-07-24 NOTE — Telephone Encounter (Signed)
Pt left v/m requesting refill of tussionex that pt was given last time for cough; pt said she has same problem had last year and prefers not to come in for appt. Does pt need to be seen?Please advise.

## 2013-08-04 ENCOUNTER — Encounter: Payer: Self-pay | Admitting: Family Medicine

## 2013-08-04 ENCOUNTER — Ambulatory Visit (INDEPENDENT_AMBULATORY_CARE_PROVIDER_SITE_OTHER): Payer: BC Managed Care – PPO | Admitting: Family Medicine

## 2013-08-04 VITALS — BP 120/70 | HR 107 | Temp 97.9°F | Ht 63.5 in | Wt 179.0 lb

## 2013-08-04 DIAGNOSIS — R05 Cough: Secondary | ICD-10-CM

## 2013-08-04 DIAGNOSIS — R059 Cough, unspecified: Secondary | ICD-10-CM

## 2013-08-04 MED ORDER — HYDROCOD POLST-CHLORPHEN POLST 10-8 MG/5ML PO LQCR: 5.0000 mL | Freq: Every evening | ORAL | Status: DC | PRN

## 2013-08-04 MED ORDER — DIAZEPAM 5 MG PO TABS: 5.0000 mg | ORAL_TABLET | Freq: Every day | ORAL | Status: DC | PRN

## 2013-08-04 NOTE — Progress Notes (Signed)
SUBJECTIVE:  Anna Burke is a 33 y.o. female who complains of cough described as dry for 20 days. She denies a history of anorexia, chest pain, chills, dizziness, fatigue, fevers, myalgias and shortness of breath and denies a history of asthma. Patient denies smoke cigarettes. She does have a h/o seasonal allergies.  Coughs mainly after she has been on the phone for several hours at work- works from home.   Patient Active Problem List   Diagnosis Date Noted  . Cough 08/04/2013  . Unspecified constipation 04/19/2013  . Chronic fatigue 01/06/2013  . Insomnia 01/24/2011  . Depression   . Interstitial cystitis    Past Medical History  Diagnosis Date  . Insomnia     Sleep study on 09/03/2009 by Dr. Maple HudsonYoung consistent with insomnia but no organic sleep disturbance identified.  . Depression   . Interstitial cystitis     [facility]   No past surgical history on file. History  Substance Use Topics  . Smoking status: Never Smoker   . Smokeless tobacco: Never Used  . Alcohol Use: Yes   Family History  Problem Relation Age of Onset  . Arthritis Mother   . Arthritis Father    Allergies  Allergen Reactions  . Sulfa Antibiotics Hives  . Ortho Evra [Norelgestromin-Eth Estradiol] Rash and Other (See Comments)    Elevated BP  . Shellfish Allergy Nausea And Vomiting   Current Outpatient Prescriptions on File Prior to Visit  Medication Sig Dispense Refill  . benzonatate (TESSALON) 200 MG capsule Take 1 capsule (200 mg total) by mouth 2 (two) times daily as needed for cough.  30 capsule  0  . buPROPion (WELLBUTRIN XL) 300 MG 24 hr tablet TAKE 1 TABLET BY MOUTH DAILY  30 tablet  2  . COLOSTRUM PO Take 2 tablets by mouth daily.      . diazepam (VALIUM) 5 MG tablet Take 5-10 mg by mouth daily as needed for muscle spasms (bladder nerve pain).       Marland Kitchen. esomeprazole (NEXIUM) 40 MG capsule Take 1 capsule (40 mg total) by mouth daily.  30 capsule  0  . hydrOXYzine (ATARAX/VISTARIL) 25 MG  tablet Take 25 mg by mouth 2 (two) times daily. Painful bladder syndrome      . methylphenidate (RITALIN) 20 MG tablet Take 1 tablet (20 mg total) by mouth 2 (two) times daily.  60 tablet  0   No current facility-administered medications on file prior to visit.   The PMH, PSH, Social History, Family History, Medications, and allergies [facility], and have been updated if relevant.  OBJECTIVE: BP 120/70  Pulse 107  Temp(Src) 97.9 F (36.6 C) (Oral)  Ht 5' 3.5" (1.613 m)  Wt 179 lb (81.194 kg)  BMI 31.21 kg/m2  SpO2 98%  LMP 07/06/2013  She appears well, vital signs are as noted. Ears normal.  Throat and pharynx normal.  Neck supple. No adenopathy in the neck. Nose is congested. Sinuses non tender. The chest is clear, without wheezes or rales.  ASSESSMENT:  allergic rhinitis  PLAN: Symptomatic therapy suggested: push fluids, Tussionex, rest and return office visit prn if symptoms persist or worsen. Lack of antibiotic effectiveness discussed with her. Call or return to clinic prn if these symptoms worsen or fail to improve as anticipated.

## 2013-08-04 NOTE — Progress Notes (Signed)
Pre visit review using our clinic review tool, if applicable. No additional management support is needed unless otherwise documented below in the visit note. 

## 2013-08-16 ENCOUNTER — Other Ambulatory Visit: Payer: Self-pay | Admitting: Family Medicine

## 2013-08-17 NOTE — Telephone Encounter (Signed)
Pt requesting medication refill. Last ov 07/2013 with no future appts scheduled. [facility]. pls advise

## 2013-09-08 ENCOUNTER — Other Ambulatory Visit: Payer: Self-pay | Admitting: Family Medicine

## 2013-09-08 NOTE — Telephone Encounter (Signed)
Pt requesting medication refill. Last f/u appt 01/2014 with no future appts scheduled. Pls advise

## 2013-09-23 ENCOUNTER — Other Ambulatory Visit: Payer: Self-pay

## 2013-09-23 NOTE — Telephone Encounter (Signed)
Pt left v/m requesting rx ritalin. Call when ready for pick up. 

## 2013-09-24 MED ORDER — METHYLPHENIDATE HCL 20 MG PO TABS
20.0000 mg | ORAL_TABLET | Freq: Two times a day (BID) | ORAL | Status: DC
Start: 2013-09-24 — End: 2013-11-13

## 2013-09-24 NOTE — Telephone Encounter (Signed)
Message left notifying patient and Rx placed up front for pick up. 

## 2013-09-24 NOTE — Telephone Encounter (Signed)
Printed and placed in Anna Burke's box. 

## 2013-10-21 ENCOUNTER — Other Ambulatory Visit: Payer: Self-pay | Admitting: Family Medicine

## 2013-10-21 NOTE — Telephone Encounter (Signed)
Pt going out of town now and wants to ck status of hydroxyzine. Med already refilled; spoke with Rob at pharmacy and rx will be ready in 15 mins. Pt voiced understanding and appreciative.

## 2013-11-04 ENCOUNTER — Other Ambulatory Visit: Payer: Self-pay | Admitting: Family Medicine

## 2013-11-13 ENCOUNTER — Other Ambulatory Visit: Payer: Self-pay

## 2013-11-13 NOTE — Telephone Encounter (Signed)
Pt left v/m requesting rx ritalin. Call when ready for pick up. Pt said last month spoke with Dr Elmer SowAron's assistant and pt thought hydroxyzine quantity was going to be increased to # 60 this month. Pt request cb.

## 2013-11-17 MED ORDER — METHYLPHENIDATE HCL 20 MG PO TABS: 20.0000 mg | ORAL_TABLET | Freq: Two times a day (BID) | ORAL | Status: DC

## 2013-11-17 MED ORDER — HYDROXYZINE HCL 25 MG PO TABS: ORAL_TABLET | ORAL | Status: DC

## 2013-11-17 NOTE — Telephone Encounter (Signed)
Lm on pts vm and informed her Rx is available for pickup at the front desk; pt also advised OV required for any additional refills

## 2013-11-18 ENCOUNTER — Encounter: Payer: Self-pay | Admitting: Family Medicine

## 2013-11-28 ENCOUNTER — Encounter (HOSPITAL_COMMUNITY): Payer: Self-pay | Admitting: Emergency Medicine

## 2013-11-28 ENCOUNTER — Emergency Department (INDEPENDENT_AMBULATORY_CARE_PROVIDER_SITE_OTHER)
Admission: EM | Admit: 2013-11-28 | Discharge: 2013-11-28 | Disposition: A | Discharge status: Home / Self Care | Payer: BC Managed Care – PPO | Source: Home / Self Care | Attending: Emergency Medicine | Admitting: Emergency Medicine

## 2013-11-28 DIAGNOSIS — L255 Unspecified contact dermatitis due to plants, except food: Secondary | ICD-10-CM

## 2013-11-28 DIAGNOSIS — L237 Allergic contact dermatitis due to plants, except food: Secondary | ICD-10-CM

## 2013-11-28 MED ORDER — TRIAMCINOLONE ACETONIDE 0.5 % EX OINT: 1.0000 "application " | TOPICAL_OINTMENT | Freq: Two times a day (BID) | CUTANEOUS | Status: DC

## 2013-11-28 NOTE — Discharge Instructions (Signed)
You have poison ivy.  It is not shingles. Use the triamcinolone ointment twice a day for the next 1-2 weeks. You can also use calamine lotion or benadryl cream as needed for itching.  Follow up with your doctor if it is not improving in the next 2-3 days.

## 2013-11-28 NOTE — ED Notes (Signed)
Reports rash on right arm, abdomen, right breast and leg.  Pt has used otc meds with no relief.  Pt voices concerns wondering if she has shingles.  Hx of chicken pox as a child.

## 2013-11-28 NOTE — ED Provider Notes (Signed)
CSN: [id number]     Arrival date & time 11/28/13  1709 History   First MD Initiated Contact with Patient 11/28/13 1726     Chief Complaint  Patient presents with  . Poison Ivy    or possibly shingles.   (Consider location/radiation/quality/duration/timing/severity/associated sxs/prior Treatment) HPI She is a 33 year old woman here today for evaluation of rash. The rash is located on her left forearm, left breast, left side, left thigh. It came up a few days ago. She's been applying a poison ivy soap to it without improvement. It is itchy. 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 She had poison ivy about a month ago, when she and her husband were pulling it from her yard. She's not been doing gardening, but does report that her cat, who is an indoor outdoor cat, has been in the of the yard multiple times.  Past Medical History  Diagnosis Date  . Insomnia     Sleep study on 09/03/2009 by Dr. Maple HudsonYoung consistent with insomnia but no organic sleep disturbance identified.  . Depression   . Interstitial cystitis     [facility]   History reviewed. No pertinent past surgical history. Family History  Problem Relation Age of Onset  . Arthritis Mother   . Arthritis Father    History  Substance Use Topics  . Smoking status: Never Smoker   . Smokeless tobacco: Never Used  . Alcohol Use: Yes   OB History   Grav Para Term Preterm Abortions TAB SAB Ect Mult Living                 Review of Systems  Constitutional: Negative.   Skin: Positive for rash.    Allergies  Sulfa antibiotics; Ortho evra; and Shellfish allergy  Home Medications   Prior to Admission medications   Medication Sig Start Date End Date Taking? Authorizing Provider  buPROPion (WELLBUTRIN XL) 300 MG 24 hr tablet TAKE 1 TABLET BY MOUTH DAILY   Yes Dianne Dunalia M Aron, MD  COLOSTRUM PO Take 2 tablets by mouth daily.   Yes Historical Provider, MD  diazepam  (VALIUM) 5 MG tablet Take 1-2 tablets (5-10 mg total) by mouth daily as needed for muscle spasms (bladder nerve pain). 08/04/13  Yes Dianne Dunalia M Aron, MD  esomeprazole (NEXIUM) 40 MG capsule Take 1 capsule (40 mg total) by mouth daily. 05/16/13  Yes Brandt LoosenJulie Manly, MD  methylphenidate (RITALIN) 20 MG tablet Take 1 tablet (20 mg total) by mouth 2 (two) times daily. 11/17/13  Yes Dianne Dunalia M Aron, MD  benzonatate (TESSALON) 200 MG capsule Take 1 capsule (200 mg total) by mouth 2 (two) times daily as needed for cough. 07/16/13   Dianne Dunalia M Aron, MD  chlorpheniramine-HYDROcodone (TUSSIONEX PENNKINETIC ER) 10-8 MG/5ML LQCR Take 5 mLs by mouth at bedtime as needed for cough. 08/04/13   Dianne Dunalia M Aron, MD  hydrOXYzine (ATARAX/VISTARIL) 25 MG tablet Take 25 mg by mouth 2 (two) times daily. Painful bladder syndrome    Historical Provider, MD  hydrOXYzine (ATARAX/VISTARIL) 25 MG tablet TAKE 1 TABLET BY MOUTH THREE TIMES DAILY AS NEEDED    Dianne Dunalia M Aron, MD  triamcinolone ointment (KENALOG) 0.5 % Apply 1 application topically 2 (two) times daily. 11/28/13   Charm RingsErin J Francys Bolin, MD   BP 146/102  Pulse 109  Temp(Src) 99.3 F (37.4 C) (Oral)  Resp 18  SpO2 99% Physical Exam  Constitutional: She appears well-developed and well-nourished.  No distress.  Cardiovascular: Normal rate.   Pulmonary/Chest: Effort normal.  Skin:  Vesicular rash on erythematous base on right forearm, right lateral breast, right side and right thigh    ED Course  Procedures (including critical care time) Labs Review Labs Reviewed - No data to display  Imaging Review No results found.   MDM   1. Poison ivy    Rash is consistent with poison ivy. Given the distribution, I highly doubt this is shingles. Recommended triamcinolone cream. Also over-the-counter Benadryl and calamine lotion as needed. Followup with primary care physician if not improving in the next 2-3 days.    Charm Rings, MD 11/28/13 [phone]

## 2013-11-30 ENCOUNTER — Telehealth: Payer: Self-pay | Admitting: Family Medicine

## 2013-11-30 NOTE — Telephone Encounter (Signed)
Confidential Office Message [address] [place], [place] [address] p. [phone] f. [phone] To: Gar Gibbon (After Hours Triage) Fax: [phone] From: [facility] Date/ Time: 11/28/2013 2:20 PM Taken By: Macarthur Critchley, RN Caller: Lanora Manis Facility: home Patient: Anna Burke, Anna Burke DOB: 1981-01-08 Phone: [phone] Reason for Call: Patient wants rash looked at before she has to go to job training on Monday 11/30/13 & wanted to know where to go. Is fearful of Shingles (vs poison ivy that looks worse than normal). Advised pt if she wants to be seen before Monday to go to [facility]. Regarding Appointment: Appt Date: Appt Time: Unknown Provider: Reason: Details: Confidential Outcome:

## 2013-12-06 ENCOUNTER — Other Ambulatory Visit: Payer: Self-pay | Admitting: Family Medicine

## 2013-12-08 ENCOUNTER — Encounter: Payer: Self-pay | Admitting: Family Medicine

## 2013-12-23 ENCOUNTER — Telehealth: Payer: Self-pay

## 2013-12-23 ENCOUNTER — Encounter: Payer: Self-pay | Admitting: Family Medicine

## 2013-12-23 ENCOUNTER — Encounter: Payer: Self-pay | Admitting: *Deleted

## 2013-12-23 ENCOUNTER — Ambulatory Visit (INDEPENDENT_AMBULATORY_CARE_PROVIDER_SITE_OTHER): Payer: BC Managed Care – PPO | Admitting: Family Medicine

## 2013-12-23 VITALS — BP 134/90 | HR 93 | Temp 98.5°F | Wt 180.0 lb

## 2013-12-23 DIAGNOSIS — F3289 Other specified depressive episodes: Secondary | ICD-10-CM

## 2013-12-23 DIAGNOSIS — Z8249 Family history of ischemic heart disease and other diseases of the circulatory system: Secondary | ICD-10-CM | POA: Insufficient documentation

## 2013-12-23 DIAGNOSIS — F32A Depression, unspecified: Secondary | ICD-10-CM

## 2013-12-23 DIAGNOSIS — F329 Major depressive disorder, single episode, unspecified: Secondary | ICD-10-CM

## 2013-12-23 DIAGNOSIS — R5381 Other malaise: Secondary | ICD-10-CM

## 2013-12-23 DIAGNOSIS — N3010 Interstitial cystitis (chronic) without hematuria: Secondary | ICD-10-CM

## 2013-12-23 DIAGNOSIS — R5383 Other fatigue: Secondary | ICD-10-CM

## 2013-12-23 DIAGNOSIS — R5382 Chronic fatigue, unspecified: Secondary | ICD-10-CM

## 2013-12-23 DIAGNOSIS — F4321 Adjustment disorder with depressed mood: Secondary | ICD-10-CM | POA: Insufficient documentation

## 2013-12-23 MED ORDER — ESOMEPRAZOLE MAGNESIUM 40 MG PO CPDR: 40.0000 mg | DELAYED_RELEASE_CAPSULE | Freq: Every day | ORAL | Status: DC

## 2013-12-23 MED ORDER — HYDROXYZINE HCL 25 MG PO TABS: ORAL_TABLET | ORAL | Status: DC

## 2013-12-23 MED ORDER — METHYLPHENIDATE HCL 20 MG PO TABS: 20.0000 mg | ORAL_TABLET | Freq: Two times a day (BID) | ORAL | Status: DC

## 2013-12-23 NOTE — Assessment & Plan Note (Signed)
Deteriorated due to acute stressors but feels she is managing ok with atarax.

## 2013-12-23 NOTE — Assessment & Plan Note (Signed)
She denies feeling depressed--more grief than anything else. Very supportive husband and her employer has been understanding. Wellbutrin rx refilled.

## 2013-12-23 NOTE — Assessment & Plan Note (Signed)
New- risk stratify with lipid panel today. The patient indicates understanding of these issues and agrees with the plan.

## 2013-12-23 NOTE — Patient Instructions (Signed)
Great to see you. I am so sorry for your loss. I will call you with your lab results.

## 2013-12-23 NOTE — Telephone Encounter (Signed)
Spoke with patient and advised her to contact her employers HR to file for FMLA and then they can fax [place] paperwork or pt can drop it off for a 5-7 business day turn around for paperwork. Pt verbalized understanding and will bring paper work to office

## 2013-12-23 NOTE — Progress Notes (Signed)
  Subjective:    Patient ID: Anna Burke, female    DOB: [birth year], 33 y.o.   MRN: [id number]  HPI  33 yo pleasant female here for follow up.  Depression- has been stable on Wellbutrin 300 mg XL daily. Unfortunately her mom died earlier this year- had CAD and did not know it. She feels she is handling this ok- some days better than others. She is thinking about going to see a grief counselor.  Chronic fatigue- has been stable on as needed Ritalin.  Does not refill monthly.  IC-   Food triggers have been the biggest issue.  Did seem worse after her mother's death. Atarax seems to help more than anything.  Patient Active Problem List   Diagnosis Date Noted  . Cough 08/04/2013  . Unspecified constipation 04/19/2013  . Chronic fatigue 01/06/2013  . Insomnia 01/24/2011  . Depression   . Interstitial cystitis    Past Medical History  Diagnosis Date  . Insomnia     Sleep study on 09/03/2009 by Dr. Maple Hudson consistent with insomnia but no organic sleep disturbance identified.  . Depression   . Interstitial cystitis     Dr. Logan Bores [facility]   No past surgical history on file. History  Substance Use Topics  . Smoking status: Never Smoker   . Smokeless tobacco: Never Used  . Alcohol Use: Yes   Family History  Problem Relation Age of Onset  . Arthritis Mother   . Arthritis Father    Allergies  Allergen Reactions  . Sulfa Antibiotics Hives  . Ortho Evra [Norelgestromin-Eth Estradiol] Rash and Other (See Comments)    Elevated BP  . Shellfish Allergy Nausea And Vomiting   Current Outpatient Prescriptions on File Prior to Visit  Medication Sig Dispense Refill  . buPROPion (WELLBUTRIN XL) 300 MG 24 hr tablet TAKE 1 TABLET BY MOUTH EVERY DAY  30 tablet  0  . COLOSTRUM PO Take 2 tablets by mouth daily.      . diazepam (VALIUM) 5 MG tablet Take 1-2 tablets (5-10 mg total) by mouth daily as needed for muscle spasms (bladder nerve pain).  30 tablet  0   No current  facility-administered medications on file prior to visit.   The PMH, PSH, Social History, Family History, Medications, and allergies have been reviewed in [facility], and have been updated if relevant.     Review of Systems    See HPI Appetite good Wt Readings from Last 3 Encounters:  12/23/13 180 lb (81.647 kg)  08/04/13 179 lb (81.194 kg)  05/16/13 188 lb 8 oz (85.503 kg)  No SI or HI +increased urinary frequency with IC, no dysuria  Objective:   Physical Exam BP 134/90  Pulse 93  Temp(Src) 98.5 F (36.9 C) (Oral)  Wt 180 lb (81.647 kg)  SpO2 98% Gen:  Alert, NAD Psych:  Good eye contact, not anxious or depressed appearing    Assessment & Plan:

## 2013-12-23 NOTE — Telephone Encounter (Signed)
Pt left v/m; pt is going to fill out paperwork for FMLA; pt request cb about proper procedure to have FMLA completed.

## 2013-12-23 NOTE — Assessment & Plan Note (Signed)
New- appropriate. Advised grief counseling. The patient indicates understanding of these issues and agrees with the plan.

## 2013-12-23 NOTE — Progress Notes (Signed)
Pre visit review using our clinic review tool, if applicable. No additional management support is needed unless otherwise documented below in the visit note. 

## 2013-12-24 ENCOUNTER — Encounter: Payer: Self-pay | Admitting: *Deleted

## 2013-12-24 LAB — LIPID PANEL
CHOL/HDL RATIO: 4
Cholesterol: 219 mg/dL — ABNORMAL HIGH (ref 0–200)
HDL: 58.9 mg/dL (ref >39.00)
NONHDL: 160.1
TRIGLYCERIDES: 246 mg/dL — AB (ref 0.0–149.0)
VLDL: 49.2 mg/dL — AB (ref 0.0–40.0)

## 2013-12-24 LAB — LDL CHOLESTEROL, DIRECT: Direct LDL: 145.4 mg/dL

## 2014-01-06 ENCOUNTER — Other Ambulatory Visit: Payer: Self-pay | Admitting: Family Medicine

## 2014-01-08 ENCOUNTER — Encounter: Payer: Self-pay | Admitting: Family Medicine

## 2014-01-16 ENCOUNTER — Other Ambulatory Visit: Payer: Self-pay | Admitting: Family Medicine

## 2014-01-18 ENCOUNTER — Other Ambulatory Visit: Payer: Self-pay | Admitting: Family Medicine

## 2014-01-18 ENCOUNTER — Telehealth: Payer: Self-pay

## 2014-01-18 NOTE — Telephone Encounter (Signed)
Pt left /vm; [facility] and pt said she saw Dr Dayton MartesAron 12/23/13. Pt request cb.

## 2014-01-19 NOTE — Telephone Encounter (Signed)
Lm on pts vm requesting a call back 

## 2014-01-20 MED ORDER — NORETHINDRONE ACET-ETHINYL EST 1-20 MG-MCG PO TABS: 1.0000 | ORAL_TABLET | Freq: Every day | ORAL | Status: DC

## 2014-01-20 NOTE — Telephone Encounter (Signed)
Per Dr Dayton MartesAron, ok to fill Rx, but pt is needing to schedule CPE for additional refills. Lm on pts vm and advised per Dr Dayton MartesAron; pt informed she will need to schedule CPE before refills run out or she will be unable to receive additional ones. Rx sent to requested pharmacy

## 2014-02-20 ENCOUNTER — Other Ambulatory Visit: Payer: Self-pay | Admitting: Family Medicine

## 2014-03-05 ENCOUNTER — Other Ambulatory Visit: Payer: Self-pay | Admitting: Family Medicine

## 2014-03-24 ENCOUNTER — Other Ambulatory Visit: Payer: Self-pay | Admitting: Family Medicine

## 2014-04-10 ENCOUNTER — Other Ambulatory Visit: Payer: Self-pay | Admitting: Family Medicine

## 2014-04-15 ENCOUNTER — Other Ambulatory Visit: Payer: Self-pay

## 2014-04-15 MED ORDER — METHYLPHENIDATE HCL 20 MG PO TABS: 20.0000 mg | ORAL_TABLET | Freq: Two times a day (BID) | ORAL | Status: DC

## 2014-04-15 NOTE — Telephone Encounter (Signed)
Pt request rx ritalin. Call when ready for pick up.last f/u visit 12/23/13. Pt is [facility].

## 2014-04-15 NOTE — Telephone Encounter (Signed)
Lm on pts vm informing her Rx is available for pickup from the front desk 

## 2014-04-21 ENCOUNTER — Other Ambulatory Visit: Payer: Self-pay

## 2014-04-21 MED ORDER — HYDROXYZINE HCL 25 MG PO TABS: ORAL_TABLET | ORAL | Status: DC

## 2014-04-21 NOTE — Telephone Encounter (Signed)
Pt left v/m requesting refill hydroxyzine to [facility]. Pt request cb when refilled.

## 2014-06-07 ENCOUNTER — Other Ambulatory Visit: Payer: Self-pay | Admitting: Family Medicine

## 2014-06-15 ENCOUNTER — Other Ambulatory Visit: Payer: Self-pay | Admitting: Family Medicine

## 2014-06-28 ENCOUNTER — Other Ambulatory Visit: Payer: Self-pay

## 2014-06-28 MED ORDER — METHYLPHENIDATE HCL 20 MG PO TABS: 20.0000 mg | ORAL_TABLET | Freq: Two times a day (BID) | ORAL | Status: DC

## 2014-06-28 NOTE — Addendum Note (Signed)
Addended by: Desmond DikeKNIGHT, Theseus Birnie H on: 06/28/2014 04:43 PM   Modules accepted: Orders

## 2014-06-28 NOTE — Telephone Encounter (Signed)
Pt left v/m requesting rx methylphenidate. Call when ready for pick up. Pt last seen 12/23/13 and no future appt scheduled.

## 2014-06-28 NOTE — Telephone Encounter (Signed)
Lm on pts vm informing her Rx is available for pickup from the front desk. Pt advised third party unable to pickup 

## 2014-06-29 ENCOUNTER — Encounter: Payer: Self-pay | Admitting: Family Medicine

## 2014-07-18 ENCOUNTER — Other Ambulatory Visit: Payer: Self-pay | Admitting: Family Medicine

## 2014-07-20 ENCOUNTER — Encounter: Payer: Self-pay | Admitting: Family Medicine

## 2014-08-02 ENCOUNTER — Telehealth: Payer: Self-pay

## 2014-08-02 NOTE — Telephone Encounter (Signed)
I cannot give her a note for work if I did not see her unfortunately.  Yes, she would need to be seen.

## 2014-08-02 NOTE — Telephone Encounter (Signed)
Lm on pts vm advising per Dr Dayton MartesAron. Advised pt to contact office if wanting to schedule OV

## 2014-08-02 NOTE — Telephone Encounter (Signed)
Pt left v/m; pt was out of work 2 weeks ago due to cold symptoms and did not think needed to be seen; pt has form for work about absentees from work; pt still has congestion and cough at night; pt wants to know if could refill good cough med ; ? tussionex or will pt need to be seen. Pt request cb.

## 2014-08-05 ENCOUNTER — Ambulatory Visit (INDEPENDENT_AMBULATORY_CARE_PROVIDER_SITE_OTHER): Payer: BLUE CROSS/BLUE SHIELD | Admitting: Family Medicine

## 2014-08-05 ENCOUNTER — Telehealth: Payer: Self-pay | Admitting: Family Medicine

## 2014-08-05 ENCOUNTER — Encounter: Payer: Self-pay | Admitting: Family Medicine

## 2014-08-05 VITALS — BP 116/78 | HR 102 | Temp 98.4°F | Wt 198.5 lb

## 2014-08-05 DIAGNOSIS — R059 Cough, unspecified: Secondary | ICD-10-CM

## 2014-08-05 DIAGNOSIS — R05 Cough: Secondary | ICD-10-CM

## 2014-08-05 MED ORDER — DIAZEPAM 5 MG PO TABS: 5.0000 mg | ORAL_TABLET | Freq: Every day | ORAL | Status: DC | PRN

## 2014-08-05 MED ORDER — METHYLPHENIDATE HCL 20 MG PO TABS: 20.0000 mg | ORAL_TABLET | Freq: Two times a day (BID) | ORAL | Status: DC

## 2014-08-05 MED ORDER — HYDROCOD POLST-CPM POLST ER 10-8 MG/5ML PO SUER: 5.0000 mL | Freq: Two times a day (BID) | ORAL | Status: DC | PRN

## 2014-08-05 NOTE — Telephone Encounter (Signed)
Patient dropped off a Sick Verification Form to be filled out and faxed to [phone].  Patient would also like to be notified when this form is faxed to the company.  The form was put in the provider's in box.

## 2014-08-05 NOTE — Progress Notes (Signed)
Pre visit review using our clinic review tool, if applicable. No additional management support is needed unless otherwise documented below in the visit note. 

## 2014-08-05 NOTE — Progress Notes (Signed)
SUBJECTIVE:  Anna Burke is a 34 y.o. female who complains of coryza, congestion, sneezing, sore throat and dry cough for 14 days. She denies a history of anorexia, chest pain, fevers, myalgias, shortness of breath, sweats, vomiting and weakness and denies a history of asthma. Patient denies smoke cigarettes.   Current Outpatient Prescriptions on File Prior to Visit  Medication Sig Dispense Refill  . buPROPion (WELLBUTRIN XL) 300 MG 24 hr tablet TAKE 1 TABLET BY MOUTH EVERY DAY 30 tablet 6  . COLOSTRUM PO Take 2 tablets by mouth daily.    . hydrOXYzine (ATARAX/VISTARIL) 25 MG tablet TAKE 1 TABLET BY MOUTH THREE TIMES DAILY AS NEEDED 60 tablet 1  . MICROGESTIN 1-20 MG-MCG tablet TAKE 1 TABLET BY MOUTH DAILY 21 tablet 2  . NEXIUM 40 MG capsule TAKE ONE CAPSULE BY MOUTH EVERY DAY 30 capsule 3   No current facility-administered medications on file prior to visit.    Allergies  Allergen Reactions  . Sulfa Antibiotics Hives  . Ortho Evra [Norelgestromin-Eth Estradiol] Rash and Other (See Comments)    Elevated BP  . Shellfish Allergy Nausea And Vomiting    Past Medical History  Diagnosis Date  . Insomnia     Sleep study on 09/03/2009 by Dr. Maple HudsonYoung consistent with insomnia but no organic sleep disturbance identified.  . Depression   . Interstitial cystitis     [facility]    History reviewed. No pertinent past surgical history.  Family History  Problem Relation Age of Onset  . Arthritis Mother   . Heart disease Mother   . Arthritis Father     History   Social History  . Marital Status: Married    Spouse Name: N/A  . Number of Children: 0  . Years of Education: N/A   Occupational History  . umemployed    Social History Main Topics  . Smoking status: Never Smoker   . Smokeless tobacco: Never Used  . Alcohol Use: Yes  . Drug Use: No  . Sexual Activity: Not on file   Other Topics Concern  . Not on file   Social History Narrative   The PMH, PSH, Social  History, Family History, Medications, and allergies [facility], and have been updated if relevant.  OBJECTIVE: BP 116/78 mmHg  Pulse 102  Temp(Src) 98.4 F (36.9 C) (Oral)  Wt 198 lb 8 oz (90.039 kg)  SpO2 97%  She appears well, vital signs are as noted. Ears normal.  Throat and pharynx normal.  Neck supple. No adenopathy in the neck. Nose is congested. Sinuses non tender. The chest is clear, without wheezes or rales.  ASSESSMENT:  viral upper respiratory illness  PLAN: Symptomatic therapy suggested: Tussionex rx printed and given to pt, push fluids, rest and return office visit prn if symptoms persist or worsen. Lack of antibiotic effectiveness discussed with her. Call or return to clinic prn if these symptoms worsen or fail to improve as anticipated.

## 2014-08-09 ENCOUNTER — Telehealth: Payer: Self-pay | Admitting: *Deleted

## 2014-08-09 NOTE — Telephone Encounter (Signed)
Lm on pts vm requesting a call back. Pt dropped off form for work to be completed for her absences. Per Dr Dayton MartesAron, we were needing to verify dates of excuse before form can be completed.

## 2014-08-23 ENCOUNTER — Other Ambulatory Visit: Payer: Self-pay | Admitting: Internal Medicine

## 2014-08-23 NOTE — Telephone Encounter (Signed)
Last seen by you 08/03/2014 with 1 refill--please advise

## 2014-09-13 ENCOUNTER — Encounter: Payer: Self-pay | Admitting: Primary Care

## 2014-09-13 ENCOUNTER — Ambulatory Visit (INDEPENDENT_AMBULATORY_CARE_PROVIDER_SITE_OTHER): Payer: BLUE CROSS/BLUE SHIELD | Admitting: Primary Care

## 2014-09-13 VITALS — BP 128/88 | HR 107 | Temp 98.2°F | Ht 63.5 in | Wt 191.8 lb

## 2014-09-13 DIAGNOSIS — R112 Nausea with vomiting, unspecified: Secondary | ICD-10-CM | POA: Diagnosis not present

## 2014-09-13 DIAGNOSIS — R109 Unspecified abdominal pain: Secondary | ICD-10-CM

## 2014-09-13 LAB — POCT URINALYSIS DIPSTICK
BILIRUBIN UA: NEGATIVE
Blood, UA: NEGATIVE
Glucose, UA: NEGATIVE
KETONES UA: NEGATIVE
Leukocytes, UA: NEGATIVE
Nitrite, UA: NEGATIVE
PROTEIN UA: NEGATIVE
Spec Grav, UA: 1.02
UROBILINOGEN UA: NEGATIVE
pH, UA: 6.5

## 2014-09-13 LAB — CBC WITH DIFFERENTIAL/PLATELET
BASOS PCT: 0.4 % (ref 0.0–3.0)
Basophils Absolute: 0.1 10*3/uL (ref 0.0–0.1)
Eosinophils Absolute: 0 10*3/uL (ref 0.0–0.7)
Eosinophils Relative: 0.3 % (ref 0.0–5.0)
HEMATOCRIT: 41.6 % (ref 36.0–46.0)
HEMOGLOBIN: 13.8 g/dL (ref 12.0–15.0)
LYMPHS ABS: 2.3 10*3/uL (ref 0.7–4.0)
LYMPHS PCT: 18.8 % (ref 12.0–46.0)
MCHC: 33 g/dL (ref 30.0–36.0)
MCV: 88.9 fl (ref 78.0–100.0)
MONOS PCT: 5.8 % (ref 3.0–12.0)
Monocytes Absolute: 0.7 10*3/uL (ref 0.1–1.0)
Neutro Abs: 9 10*3/uL — ABNORMAL HIGH (ref 1.4–7.7)
Neutrophils Relative %: 74.7 % (ref 43.0–77.0)
PLATELETS: 455 10*3/uL — AB (ref 150.0–400.0)
RBC: 4.68 Mil/uL (ref 3.87–5.11)
RDW: 14.2 % (ref 11.5–15.5)
WBC: 12.1 10*3/uL — AB (ref 4.0–10.5)

## 2014-09-13 LAB — COMPREHENSIVE METABOLIC PANEL
ALBUMIN: 4.4 g/dL (ref 3.5–5.2)
ALT: 33 U/L (ref 0–35)
AST: 26 U/L (ref 0–37)
Alkaline Phosphatase: 118 U/L — ABNORMAL HIGH (ref 39–117)
BILIRUBIN TOTAL: 0.4 mg/dL (ref 0.2–1.2)
BUN: 10 mg/dL (ref 6–23)
CALCIUM: 9.4 mg/dL (ref 8.4–10.5)
CO2: 25 meq/L (ref 19–32)
Chloride: 103 mEq/L (ref 96–112)
Creatinine, Ser: 0.78 mg/dL (ref 0.40–1.20)
GFR: 89.97 mL/min (ref >60.00)
Glucose, Bld: 93 mg/dL (ref 70–99)
Potassium: 3.8 mEq/L (ref 3.5–5.1)
SODIUM: 135 meq/L (ref 135–145)
Total Protein: 8 g/dL (ref 6.0–8.3)

## 2014-09-13 MED ORDER — ONDANSETRON 4 MG PO TBDP: 4.0000 mg | ORAL_TABLET | Freq: Three times a day (TID) | ORAL | Status: DC | PRN

## 2014-09-13 NOTE — Progress Notes (Signed)
Pre visit review using our clinic review tool, if applicable. No additional management support is needed unless otherwise documented below in the visit note. 

## 2014-09-13 NOTE — Patient Instructions (Signed)
Your urine did not show any infection. You may take Zofran tablets every 8 hours as needed for vomiting. Complete lab work prior to leaving today. I will notify you of your results. You really need to hydrate yourself with water and low calorie gatorade. Start your diet very slowly with bland foods such as soup, crackers, rice, toast. Call me if you experience any worsening pain and/or vomiting.  It was nice meeting you!

## 2014-09-13 NOTE — Progress Notes (Signed)
Subjective:    Patient ID: Anna Burke, female    DOB: Aug 14, 1980, 34 y.o.   MRN: [id number]  HPI  Anna Burke is a 34 year old female who presents today with a chief complaint of nausea and vomiting. She has a history of interstitial cystitis and has been experiencing her typical symptoms of body aches, flank pain, and urinary urgency for one week. Yesterday she woke up and felt worsening generalized body aches with some sciatica down her left leg. Last night she started feeling nauseated and vomited multiple times. Her last episode of vomiting was at 6 am. She drank some carbonated water this morning without vomiting. During typical flare-ups she will do yoga, apply heat, stretching, and take valium, all of which have not provided relief.    Review of Systems  Constitutional: Positive for chills. Negative for fever.  HENT: Negative for congestion, rhinorrhea, sinus pressure and sore throat.   Respiratory: Negative for cough.   Gastrointestinal: Positive for nausea and vomiting. Negative for diarrhea and blood in stool.       Generalized abdominal cramping from vomiting.  Genitourinary: Negative for dysuria.       Feels like a flare-up of interstitial cystitis.  Musculoskeletal: Positive for myalgias.       Past Medical History  Diagnosis Date  . Insomnia     Sleep study on 09/03/2009 by Dr. Annamaria Boots consistent with insomnia but no organic sleep disturbance identified.  . Depression   . Interstitial cystitis     Dr. Amalia Hailey [facility]    History   Social History  . Marital Status: Married    Spouse Name: N/A  . Number of Children: 0  . Years of Education: N/A   Occupational History  . umemployed    Social History Main Topics  . Smoking status: Never Smoker   . Smokeless tobacco: Never Used  . Alcohol Use: Yes  . Drug Use: No  . Sexual Activity: Not on file   Other Topics Concern  . Not on file   Social History Narrative    No past surgical history on  file.  Family History  Problem Relation Age of Onset  . Arthritis Mother   . Heart disease Mother   . Arthritis Father     Allergies  Allergen Reactions  . Sulfa Antibiotics Hives  . Ortho Evra [Norelgestromin-Eth Estradiol] Rash and Other (See Comments)    Elevated BP  . Shellfish Allergy Nausea And Vomiting    Current Outpatient Prescriptions on File Prior to Visit  Medication Sig Dispense Refill  . buPROPion (WELLBUTRIN XL) 300 MG 24 hr tablet TAKE 1 TABLET BY MOUTH EVERY DAY 30 tablet 6  . COLOSTRUM PO Take 2 tablets by mouth daily.    . diazepam (VALIUM) 5 MG tablet Take 1-2 tablets (5-10 mg total) by mouth daily as needed for muscle spasms (bladder nerve pain). 30 tablet 0  . hydrOXYzine (ATARAX/VISTARIL) 25 MG tablet TAKE 1 TABLET BY MOUTH THREE TIMES DAILY AS NEEDED 60 tablet 1  . methylphenidate (RITALIN) 20 MG tablet Take 1 tablet (20 mg total) by mouth 2 (two) times daily. 60 tablet 0  . NEXIUM 40 MG capsule TAKE ONE CAPSULE BY MOUTH EVERY DAY 30 capsule 3   No current facility-administered medications on file prior to visit.    BP 128/88 mmHg  Pulse 107  Temp(Src) 98.2 F (36.8 C) (Oral)  Ht 5' 3.5" (1.613 m)  Wt 191 lb 12.8 oz (87 kg)  BMI  33.44 kg/m2  SpO2 98%    Objective:   Physical Exam  Constitutional: She appears well-nourished.  Cardiovascular: Regular rhythm.   Sinus tachycardia at 105  Pulmonary/Chest: Effort normal and breath sounds normal.  Abdominal: Soft. Bowel sounds are normal. There is tenderness in the right lower quadrant, suprapubic area and left lower quadrant. There is CVA tenderness. There is no rebound and no tenderness at McBurney's point.  Skin: Skin is warm and dry.          Assessment & Plan:  Nausea and vomiting:  UA: Negative for leuks, nitrites, blood, protein. CBC with slightly elevated WBC count with shift to the left. Alk phos slightly elevated. Spoke with patient this evening and she's tolerating fluids and  solids. No additional vomiting since 6am this morning. Abdominal cramping still present. Zofran has helped to reduce nausea. She is to call with an update this Wednesday. If no additional improvement, will consider antibiotics. She verbalized understanding.

## 2014-11-21 ENCOUNTER — Other Ambulatory Visit: Payer: Self-pay | Admitting: Family Medicine

## 2014-12-09 ENCOUNTER — Other Ambulatory Visit: Payer: Self-pay

## 2014-12-09 MED ORDER — METHYLPHENIDATE HCL 20 MG PO TABS: 20.0000 mg | ORAL_TABLET | Freq: Two times a day (BID) | ORAL | Status: DC

## 2014-12-09 NOTE — Telephone Encounter (Signed)
Lm on pts vm informing her Rx is available for pickup from the front desk;advised third party unable to pickup 

## 2014-12-09 NOTE — Telephone Encounter (Signed)
Pt left v/m requesting rx ritalin. Call when ready for pick up. rx last printed #60 on 08/05/14. Pt last f/u appt on 12/23/13 and no future appt scheduled.

## 2014-12-15 ENCOUNTER — Encounter: Payer: Self-pay | Admitting: Family Medicine

## 2015-01-04 ENCOUNTER — Encounter: Payer: Self-pay | Admitting: Family Medicine

## 2015-01-19 ENCOUNTER — Other Ambulatory Visit: Payer: Self-pay | Admitting: Family Medicine

## 2015-01-19 NOTE — Telephone Encounter (Signed)
Lm on pts vm and informed her Rx sent but OV required for any additional refills. Depression last addressed 12/2013

## 2015-02-16 ENCOUNTER — Other Ambulatory Visit: Payer: Self-pay | Admitting: Family Medicine

## 2015-02-16 NOTE — Telephone Encounter (Signed)
Lm on pts vm informing her that Rx has been denied. Last f/u 12/2013; pt advised with previous refill OV required for additional.

## 2015-03-02 ENCOUNTER — Other Ambulatory Visit: Payer: Self-pay

## 2015-03-02 MED ORDER — BUPROPION HCL ER (XL) 300 MG PO TB24: 300.0000 mg | ORAL_TABLET | Freq: Every day | ORAL | Status: DC

## 2015-03-02 NOTE — Telephone Encounter (Signed)
Pt request refill bupropion until CPX on 03/30/15.last filled # 30 on 01/19/15. Per protocol advised pt refilled # 30 to [facility]. Pt voiced understanding.

## 2015-03-09 ENCOUNTER — Other Ambulatory Visit: Payer: Self-pay

## 2015-03-09 MED ORDER — METHYLPHENIDATE HCL 20 MG PO TABS: 20.0000 mg | ORAL_TABLET | Freq: Two times a day (BID) | ORAL | Status: DC

## 2015-03-09 NOTE — Telephone Encounter (Signed)
RX printed and signed, given to Waynetta 

## 2015-03-09 NOTE — Telephone Encounter (Signed)
Pt left v/m requesting rx ritalin. Call when ready for pick up. Last printed # 60 on 12/09/14 and last f/u appt 12/23/13. Pt has CPX on 03/30/15. Dr Dayton MartesAron out of office.Please advise.

## 2015-03-09 NOTE — Telephone Encounter (Signed)
Lm on pts vm and informed her Rx is available for pickup from the front desk 

## 2015-03-10 ENCOUNTER — Telehealth: Payer: Self-pay | Admitting: Family Medicine

## 2015-03-10 NOTE — Telephone Encounter (Signed)
Patient Name: Anna Burke  DOB: [birth year]    Initial Comment Caller states, went off Birth control for a yrs, then she started it back again a month ago, now has a period which has lasted 9 days, which is 3 times as long as her normal one is.    Nurse Assessment  Nurse: Stefano GaulStringer, RN, Dwana CurdVera Date/Time Lamount Cohen(Eastern Time): 03/10/2015 2:43:23 PM  Confirm and document reason for call. If symptomatic, describe symptoms. ---Caller states she went off birth control pills for a year. Started on her birth control pills a month ago. had one period and 2.5 weeks later, she had another period and has bled for 9 days. Changes her pad 3-4 days. She is cramping a little.  Has the patient traveled out of the country within the last 30 days? ---Not Applicable  Does the patient have any new or worsening symptoms? ---Yes  Will a triage be completed? ---Yes  Related visit to physician within the last 2 weeks? ---No  Does the PT have any chronic conditions? (i.e. diabetes, asthma, etc.) ---No  Did the patient indicate they were pregnant? ---No  Is this a behavioral health or substance abuse call? ---No     Guidelines    Guideline Title Affirmed Question Affirmed Notes  Vaginal Bleeding - Abnormal Periods last > 7 days    Final Disposition User   See PCP within 2 Marin CommentWeeks Stringer, RN, Dwana CurdVera    Comments  Appt scheduled for 03/16/2015 at 9 am with [name]  she is changing her pad 3-4 times daily   Referrals  REFERRED TO PCP OFFICE   Disagree/Comply: Comply

## 2015-03-10 NOTE — Telephone Encounter (Signed)
Pt has appt with Dr Dayton MartesAron on 03/16/15.

## 2015-03-16 ENCOUNTER — Ambulatory Visit: Payer: Self-pay | Admitting: Family Medicine

## 2015-03-23 ENCOUNTER — Other Ambulatory Visit: Payer: BLUE CROSS/BLUE SHIELD

## 2015-03-24 ENCOUNTER — Other Ambulatory Visit: Payer: BLUE CROSS/BLUE SHIELD

## 2015-03-25 ENCOUNTER — Other Ambulatory Visit: Payer: Self-pay | Admitting: Internal Medicine

## 2015-03-25 ENCOUNTER — Other Ambulatory Visit (INDEPENDENT_AMBULATORY_CARE_PROVIDER_SITE_OTHER): Payer: BLUE CROSS/BLUE SHIELD

## 2015-03-25 DIAGNOSIS — Z0000 Encounter for general adult medical examination without abnormal findings: Secondary | ICD-10-CM

## 2015-03-25 LAB — COMPREHENSIVE METABOLIC PANEL
ALK PHOS: 72 U/L (ref 39–117)
ALT: 13 U/L (ref 0–35)
AST: 19 U/L (ref 0–37)
Albumin: 3.6 g/dL (ref 3.5–5.2)
BUN: 11 mg/dL (ref 6–23)
CO2: 25 meq/L (ref 19–32)
CREATININE: 0.86 mg/dL (ref 0.40–1.20)
Calcium: 9 mg/dL (ref 8.4–10.5)
Chloride: 104 mEq/L (ref 96–112)
GFR: 80.14 mL/min (ref >60.00)
GLUCOSE: 82 mg/dL (ref 70–99)
Potassium: 4.2 mEq/L (ref 3.5–5.1)
Sodium: 136 mEq/L (ref 135–145)
Total Bilirubin: 0.3 mg/dL (ref 0.2–1.2)
Total Protein: 6.9 g/dL (ref 6.0–8.3)

## 2015-03-25 LAB — CBC
HCT: 37.2 % (ref 36.0–46.0)
Hemoglobin: 12.1 g/dL (ref 12.0–15.0)
MCHC: 32.7 g/dL (ref 30.0–36.0)
MCV: 90.7 fl (ref 78.0–100.0)
Platelets: 369 10*3/uL (ref 150.0–400.0)
RBC: 4.11 Mil/uL (ref 3.87–5.11)
RDW: 13.6 % (ref 11.5–15.5)
WBC: 9.2 10*3/uL (ref 4.0–10.5)

## 2015-03-25 LAB — LIPID PANEL
CHOL/HDL RATIO: 3
CHOLESTEROL: 176 mg/dL (ref 0–200)
HDL: 56.7 mg/dL (ref >39.00)
LDL Cholesterol: 85 mg/dL (ref 0–99)
NonHDL: 118.96
TRIGLYCERIDES: 171 mg/dL — AB (ref 0.0–149.0)
VLDL: 34.2 mg/dL (ref 0.0–40.0)

## 2015-03-25 LAB — TSH: TSH: 1.32 u[IU]/mL (ref 0.35–4.50)

## 2015-03-28 ENCOUNTER — Other Ambulatory Visit: Payer: Self-pay | Admitting: Family Medicine

## 2015-03-28 NOTE — Telephone Encounter (Signed)
Will fill additional 30D. Pt has upcoming CPE 12/21

## 2015-03-30 ENCOUNTER — Ambulatory Visit (INDEPENDENT_AMBULATORY_CARE_PROVIDER_SITE_OTHER): Payer: BLUE CROSS/BLUE SHIELD | Admitting: Family Medicine

## 2015-03-30 ENCOUNTER — Encounter: Payer: Self-pay | Admitting: Family Medicine

## 2015-03-30 ENCOUNTER — Other Ambulatory Visit (HOSPITAL_COMMUNITY)
Admission: RE | Admit: 2015-03-30 | Discharge: 2015-03-30 | Disposition: A | Discharge status: Home / Self Care | Payer: BLUE CROSS/BLUE SHIELD | Source: Ambulatory Visit | Attending: Family Medicine | Admitting: Family Medicine

## 2015-03-30 VITALS — BP 112/60 | HR 99 | Temp 98.1°F | Ht 63.25 in | Wt 201.5 lb

## 2015-03-30 DIAGNOSIS — Z0000 Encounter for general adult medical examination without abnormal findings: Secondary | ICD-10-CM

## 2015-03-30 DIAGNOSIS — R87810 Cervical high risk human papillomavirus (HPV) DNA test positive: Secondary | ICD-10-CM | POA: Insufficient documentation

## 2015-03-30 DIAGNOSIS — Z8249 Family history of ischemic heart disease and other diseases of the circulatory system: Secondary | ICD-10-CM

## 2015-03-30 DIAGNOSIS — N760 Acute vaginitis: Secondary | ICD-10-CM | POA: Diagnosis present

## 2015-03-30 DIAGNOSIS — Z1151 Encounter for screening for human papillomavirus (HPV): Secondary | ICD-10-CM | POA: Diagnosis present

## 2015-03-30 DIAGNOSIS — F329 Major depressive disorder, single episode, unspecified: Secondary | ICD-10-CM | POA: Diagnosis not present

## 2015-03-30 DIAGNOSIS — F32A Depression, unspecified: Secondary | ICD-10-CM

## 2015-03-30 DIAGNOSIS — G4700 Insomnia, unspecified: Secondary | ICD-10-CM

## 2015-03-30 DIAGNOSIS — Z01419 Encounter for gynecological examination (general) (routine) without abnormal findings: Secondary | ICD-10-CM | POA: Diagnosis present

## 2015-03-30 DIAGNOSIS — N3010 Interstitial cystitis (chronic) without hematuria: Secondary | ICD-10-CM | POA: Diagnosis not present

## 2015-03-30 DIAGNOSIS — Z113 Encounter for screening for infections with a predominantly sexual mode of transmission: Secondary | ICD-10-CM | POA: Diagnosis present

## 2015-03-30 DIAGNOSIS — F4321 Adjustment disorder with depressed mood: Secondary | ICD-10-CM

## 2015-03-30 NOTE — Progress Notes (Signed)
Subjective:    Patient ID: Anna Burke, female    DOB: Nov 13, 1980, 34 y.o.   MRN: [id number]  HPI  34 yo pleasant female here for CPX and follow up of chronic medical conditions.  Due for pap smear.  Has not been to GYN in over 6 years.  No recent h/o abnormal pap smears. Sexually active but not currently as she and her husband are currently separated -on  OCPs for contraception. She is pleased with current OCP.  Periods are light.  Depression- has been stable on Wellbutrin 300 mg XL daily.  Chronic fatigue- has been stable on as needed Ritalin.  Does not refill monthly.  IC-   Food triggers have been the biggest issue.    Lab Results  Component Value Date   CHOL 176 03/25/2015   HDL 56.70 03/25/2015   LDLCALC 85 03/25/2015   LDLDIRECT 145.4 12/23/2013   TRIG 171.0* 03/25/2015   CHOLHDL 3 03/25/2015   Lab Results  Component Value Date   CREATININE 0.86 03/25/2015   Lab Results  Component Value Date   WBC 9.2 03/25/2015   HGB 12.1 03/25/2015   HCT 37.2 03/25/2015   MCV 90.7 03/25/2015   PLT 369.0 03/25/2015   Lab Results  Component Value Date   TSH 1.32 03/25/2015   Lab Results  Component Value Date   NA 136 03/25/2015   K 4.2 03/25/2015   CL 104 03/25/2015   CO2 25 03/25/2015      Patient Active Problem List   Diagnosis Date Noted  . Well woman exam 03/30/2015  . Family history of premature CAD 12/23/2013  . Unspecified constipation 04/19/2013  . Chronic fatigue 01/06/2013  . Insomnia 01/24/2011  . Depression   . Interstitial cystitis    Past Medical History  Diagnosis Date  . Insomnia     Sleep study on 09/03/2009 by Dr. Maple HudsonYoung consistent with insomnia but no organic sleep disturbance identified.  . Depression   . Interstitial cystitis     [facility]   No past surgical history on file. Social History  Substance Use Topics  . Smoking status: Never Smoker   . Smokeless tobacco: Never Used  . Alcohol Use: Yes   Family History   Problem Relation Age of Onset  . Arthritis Mother   . Heart disease Mother   . Arthritis Father    Allergies  Allergen Reactions  . Sulfa Antibiotics Hives  . Ortho Evra [Norelgestromin-Eth Estradiol] Rash and Other (See Comments)    Elevated BP  . Shellfish Allergy Nausea And Vomiting   Current Outpatient Prescriptions on File Prior to Visit  Medication Sig Dispense Refill  . buPROPion (WELLBUTRIN XL) 300 MG 24 hr tablet TAKE 1 TABLET (300 MG TOTAL) BY MOUTH DAILY. 30 tablet 0  . COLOSTRUM PO Take 2 tablets by mouth daily.    . hydrOXYzine (ATARAX/VISTARIL) 25 MG tablet TAKE 1 TABLET BY MOUTH THREE TIMES DAILY AS NEEDED 60 tablet 1  . methylphenidate (RITALIN) 20 MG tablet Take 1 tablet (20 mg total) by mouth 2 (two) times daily. 60 tablet 0  . NEXIUM 40 MG capsule TAKE ONE CAPSULE BY MOUTH EVERY DAY 30 capsule 3   No current facility-administered medications on file prior to visit.   The PMH, PSH, Social History, Family History, Medications, and allergies have been [facility], and have been updated if relevant.     Review of Systems  Constitutional: Negative.   HENT: Negative.   Respiratory: Negative.  Cardiovascular: Negative.   Gastrointestinal: Negative.   Endocrine: Negative.   Genitourinary: Negative.   Musculoskeletal: Negative.   Skin: Negative.   Allergic/Immunologic: Negative.   Neurological: Negative.   Hematological: Negative.   Psychiatric/Behavioral: Negative.   All other systems reviewed and are negative.   Wt Readings from Last 3 Encounters:  03/30/15 201 lb 8 oz (91.4 kg)  09/13/14 191 lb 12.8 oz (87 kg)  08/05/14 198 lb 8 oz (90.039 kg)    Objective:   Physical Exam BP 112/60 mmHg  Pulse 99  Temp(Src) 98.1 F (36.7 C) (Oral)  Ht 5' 3.25" (1.607 m)  Wt 201 lb 8 oz (91.4 kg)  BMI 35.39 kg/m2  SpO2 96%   General:  Well-developed,well-nourished,in no acute distress; alert,appropriate and cooperative throughout examination Head:   normocephalic and atraumatic.   Eyes:  vision grossly intact, pupils equal, pupils round, and pupils reactive to light.   Ears:  R ear normal and L ear normal.   Nose:  no external deformity.   Mouth:  good dentition.   Neck:  No deformities, masses, or tenderness noted. Breasts:  No mass, nodules, thickening, tenderness, bulging, retraction, inflamation, nipple discharge or skin changes noted.   Lungs:  Normal respiratory effort, chest expands symmetrically. Lungs are clear to auscultation, no crackles or wheezes. Heart:  Normal rate and regular rhythm. S1 and S2 normal without gallop, murmur, click, rub or other extra sounds. Abdomen:  Bowel sounds positive,abdomen soft and non-tender without masses, organomegaly or hernias noted. Rectal:  no external abnormalities.   Genitalia:  Pelvic Exam:        External: normal female genitalia without lesions or masses        Vagina: normal without lesions or masses        Cervix: normal without lesions or masses        Adnexa: normal bimanual exam without masses or fullness        Uterus: normal by palpation        Pap smear: performed Msk:  No deformity or scoliosis noted of thoracic or lumbar spine.   Extremities:  No clubbing, cyanosis, edema, or deformity noted with normal full range of motion of all joints.   Neurologic:  alert & oriented X3 and gait normal.   Skin:  Intact without suspicious lesions or rashes Cervical Nodes:  No lymphadenopathy noted Axillary Nodes:  No palpable lymphadenopathy Psych:  Cognition and judgment appear intact. Alert and cooperative with normal attention span and concentration. No apparent delusions, illusions, hallucinations     Assessment & Plan:

## 2015-03-30 NOTE — Assessment & Plan Note (Signed)
Reviewed preventive care protocols, scheduled due services, and updated immunizations Discussed nutrition, exercise, diet, and healthy lifestyle.  Pap smear done today. 

## 2015-03-30 NOTE — Assessment & Plan Note (Signed)
Feels symptoms controlled with diet and current rxs. No changes made today.

## 2015-03-30 NOTE — Assessment & Plan Note (Signed)
Well controlled on current rx. No changes made today. 

## 2015-03-30 NOTE — Addendum Note (Signed)
Addended by: Desmond DikeKNIGHT, Mildreth Reek H on: 03/30/2015 01:52 PM   Modules accepted: Orders

## 2015-03-30 NOTE — Progress Notes (Signed)
Pre visit review using our clinic review tool, if applicable. No additional management support is needed unless otherwise documented below in the visit note. 

## 2015-04-01 LAB — CYTOLOGY - PAP

## 2015-04-02 ENCOUNTER — Other Ambulatory Visit: Payer: Self-pay | Admitting: Family Medicine

## 2015-04-05 LAB — CERVICOVAGINAL ANCILLARY ONLY: Herpes: NEGATIVE

## 2015-04-05 NOTE — Telephone Encounter (Signed)
Last f/u 03/2015 

## 2015-04-05 NOTE — Telephone Encounter (Signed)
Rx called in to requested pharmacy 

## 2015-04-06 LAB — CERVICOVAGINAL ANCILLARY ONLY
Bacterial vaginitis: NEGATIVE
Candida vaginitis: NEGATIVE

## 2015-04-25 ENCOUNTER — Other Ambulatory Visit: Payer: Self-pay | Admitting: *Deleted

## 2015-04-25 MED ORDER — HYDROXYZINE HCL 25 MG PO TABS: 25.0000 mg | ORAL_TABLET | Freq: Three times a day (TID) | ORAL | Status: DC | PRN

## 2015-04-25 MED ORDER — BUPROPION HCL ER (XL) 300 MG PO TB24: ORAL_TABLET | ORAL | Status: DC

## 2015-04-25 NOTE — Telephone Encounter (Signed)
Received faxed refill request from pharmacy Last refill 11/22/14 #60/1 Last office visit 03/30/15 Is it okay to refill?

## 2015-05-04 ENCOUNTER — Telehealth: Payer: Self-pay

## 2015-05-04 NOTE — Telephone Encounter (Signed)
PLEASE NOTE: All timestamps contained within this report are represented as [place] Standard Time. CONFIDENTIALTY NOTICE: This fax transmission is intended only for the addressee. It contains information that is legally privileged, confidential or otherwise protected from use or disclosure. If you are not the intended recipient, you are strictly prohibited from reviewing, disclosing, copying using or disseminating any of this information or taking any action in reliance on or regarding this information. If you have received this fax in error, please notify us immediately by telephone so that we can arrange for its return to [place]. Phone: [phone], Toll-Free: [phone], Fax: [phone] Page: 1 of 2 Call Id: [id number] [facility] Night - Client TELEPHONE ADVICE RECORD [facility] Patient Name: Anna Burke Gender: Female DOB: February 24, 1981 Age: 35 Y 4 M 29 D Return Phone Number: [phone] (Primary) Address[place] [address] Client [facility] Night - Client Client Site [facility] - Night Contact Type Call Call Type Triage / Clinical Relationship To Patient Self Return Phone Number [phone] (Primary) Chief Complaint Nasal Congestion Initial Comment Caller states that she is having nasal congestion. PreDisposition Call Doctor Nurse Assessment Nurse: Glendell Docker, RN, Sajjad Date/Time Lamount Cohen Time): 05/03/2015 6:19:41 PM Confirm and document reason for call. If symptomatic, describe symptoms. You must click the next button to save text entered. ---caller thinks she has the flu, had a fever of 101.5, whole body aching, nasal congestion, started monday night Has the patient traveled out of the country within the last 30 days? ---Yes Where have you traveled? ([place] for Ebola and Ebola guideline, [place], [place] for MERS) ---[place] Does the patient have  any new or worsening symptoms? ---Yes Will a triage be completed? ---Yes Related visit to physician within the last 2 weeks? ---No Does the PT have any chronic conditions? (i.e. diabetes, asthma, etc.) ---No Did the patient indicate they were pregnant? ---No Is this a behavioral health or substance abuse call? ---No Guidelines Guideline Title Affirmed Question Affirmed Notes Nurse Date/Time (Eastern Time) Common Cold Cold with no complications (all triage questions negative) Naseem, RN, Sajjad 05/03/2015 6:24:14 PM Disp. Time Lamount Cohen Time) Disposition Final User 05/03/2015 6:29:55 PM Home Care Yes Naseem, RN, Sajjad Caller Understands: Yes Disagree/Comply: Comply PLEASE NOTE: All timestamps contained within this report are represented as [place] Standard Time. CONFIDENTIALTY NOTICE: This fax transmission is intended only for the addressee. It contains information that is legally privileged, confidential or otherwise protected from use or disclosure. If you are not the intended recipient, you are strictly prohibited from reviewing, disclosing, copying using or disseminating any of this information or taking any action in reliance on or regarding this information. If you have received this fax in error, please notify us immediately by telephone so that we can arrange for its return to [place]. Phone: [phone], Toll-Free: [phone], Fax: [phone] Page: 2 of 2 Call Id: [id number] Care Advice Given Per Guideline HOME CARE: You should be able to treat this at home. * Colds are very common and may make you feel uncomfortable. REASSURANCE: * Colds are caused by viruses, and no medicine or 'shot' will cure an uncomplicated cold. * Colds are usually not serious. FOR A STUFFY NOSE - USE NASAL WASHES: * Introduction: Saline (salt water) nasal irrigation (nasal wash) is an effective and simple home remedy for treating stuffy nose and sinus congestion. The nose can be irrigated by pouring,  spraying, or squirting salt water into the nose and  then letting it run back out. * How it Helps: The salt water rinses out excess mucus, washes out any irritants (dust, allergens) that might be present, and moistens the nasal cavity. * Antihistamines: Are only helpful if you also have nasal allergies. MEDICINES FOR STUFFY OR RUNNY NOSE: * Most cold medicines that are available over-the-counter (OTC) are not helpful. CAUTION - NASAL DECONGESTANTS: * For muscle aches, headaches, or moderate fever (over 101 degrees F) (38.9 C) use acetaminophen every 4 hours. * Sore throat: throat lozenges, hard candy or warm chicken broth. * Cough: use cough drops. HUMIDIFIER: If the air in your home is dry, use a humidifier. * Fever 2-3 days * Nasal discharge 7-14 days * Cough 2-3 weeks. CALL BACK IF: * Fever lasts over 3 days * Runny nose lasts over 10 days * You become short of breath * You become worse. CARE ADVICE given per Colds (Adult) guideline. * It sounds like an uncomplicated cold that we can treat at home. After Care Instructions Given Call Event Type User Date / Time Description

## 2015-05-04 NOTE — Telephone Encounter (Signed)
PLEASE NOTE: All timestamps contained within this report are represented as [place] Standard Time. CONFIDENTIALTY NOTICE: This fax transmission is intended only for the addressee. It contains information that is legally privileged, confidential or otherwise protected from use or disclosure. If you are not the intended recipient, you are strictly prohibited from reviewing, disclosing, copying using or disseminating any of this information or taking any action in reliance on or regarding this information. If you have received this fax in error, please notify us immediately by telephone so that we can arrange for its return to [place]. Phone: [phone], Toll-Free: [phone], Fax: [phone] Page: 1 of 1 Call Id: [id number] [facility] Night - Client TELEPHONE ADVICE RECORD [facility] Patient Name: Anna Burke Gender: Female DOB: 05-13-1980 Age: 35 Y 4 M 29 D Return Phone Number: [phone] (Primary) Address: [place] [address] Client [facility] Night - Client Client Site [facility] - Night Physician Dayton Martes, [place] Contact Type Call Call Type Triage / Clinical Caller Name Eleanore Relationship To Patient Self Return Phone Number [phone] (Primary) Chief Complaint Flu Symptom Initial Comment Caller states she definitely have the flu. Current temp at 101.4, just took some Tylenol. Nurse Assessment Guidelines Guideline Title Affirmed Question Affirmed Notes Nurse Date/Time (Eastern Time) Disp. Time Lamount Cohen Time) Disposition Final User 05/03/2015 4:55:58 PM Attempt made - message left Naseem, RN, Sajjad 05/03/2015 5:10:22 PM Attempt made - message left Naseem, RN, Sajjad 05/03/2015 5:31:44 PM FINAL ATTEMPT MADE - message left Yes Naseem, RN, Sajjad After Care Instructions Given Call Event Type User Date / Time Description

## 2015-05-10 ENCOUNTER — Telehealth: Payer: Self-pay | Admitting: Family Medicine
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
## 2015-05-17 ENCOUNTER — Other Ambulatory Visit: Payer: Self-pay

## 2015-05-17 NOTE — Telephone Encounter (Signed)
Pt left v/m; pt had flu a 2 weeks ago and still has residual cough and congestion; pt wants to know if Dr Dayton Martes would prescribe cough med without pt coming in to be seen. Pt said Dr Dayton Martes prescribed antihistamine and decongestant cough med previously. Pt [facility]. Pt also request printed rx for ritalin. Call when ready for pick up. Last printed # 60 on 03/09/15 seen 03/30/15 for annual exam

## 2015-05-18 ENCOUNTER — Telehealth: Payer: Self-pay | Admitting: Family Medicine

## 2015-05-18 MED ORDER — METHYLPHENIDATE HCL 20 MG PO TABS: 20.0000 mg | ORAL_TABLET | Freq: Two times a day (BID) | ORAL | Status: DC

## 2015-05-18 MED ORDER — HYDROCOD POLST-CPM POLST ER 10-8 MG/5ML PO SUER: 5.0000 mL | Freq: Two times a day (BID) | ORAL | Status: DC | PRN

## 2015-05-18 NOTE — Telephone Encounter (Signed)
See note requesting cough suppressant.  Rx for tussionex printed out.  She needs OV if symptoms fail to improve or if they progress.

## 2015-05-18 NOTE — Telephone Encounter (Signed)
Lm on pts vm and informed her Rx is available for pickup from the front desk 

## 2015-05-19 NOTE — Telephone Encounter (Signed)
Pt left v/m requesting cb about cough med.

## 2015-05-20 NOTE — Telephone Encounter (Signed)
Lm on pts vm and informed her that cough med, along with additional medication she requested is available for pickup. Message was left for pt 2/8. See additional phone note. Should pt contact office back, pls advise her that both medications are available.

## 2015-06-28 ENCOUNTER — Other Ambulatory Visit: Payer: Self-pay | Admitting: Family Medicine

## 2015-06-29 NOTE — Telephone Encounter (Signed)
Ok to refill 

## 2015-06-29 NOTE — Telephone Encounter (Signed)
Which rx? 

## 2015-07-09 ENCOUNTER — Telehealth: Payer: Self-pay | Admitting: Family Medicine

## 2015-07-13 NOTE — Telephone Encounter (Signed)
Yes should be #60

## 2015-07-13 NOTE — Telephone Encounter (Signed)
Pt left v/m requesting clarification of quantity for hydroxyzine; # 6 was sent to pharmacy and pt usually gets # 60. Pt request cb.

## 2015-07-14 MED ORDER — HYDROXYZINE HCL 25 MG PO TABS: 25.0000 mg | ORAL_TABLET | Freq: Three times a day (TID) | ORAL | Status: DC | PRN

## 2015-07-14 NOTE — Telephone Encounter (Signed)
New Rx sent to pharmacy

## 2015-07-14 NOTE — Addendum Note (Signed)
Addended by: Desmond DikeKNIGHT, Alejandra Hunt H on: 07/14/2015 07:37 AM   Modules accepted: Orders

## 2015-08-30 ENCOUNTER — Other Ambulatory Visit: Payer: Self-pay | Admitting: Family Medicine

## 2015-08-31 ENCOUNTER — Other Ambulatory Visit: Payer: Self-pay | Admitting: *Deleted

## 2015-08-31 NOTE — Telephone Encounter (Signed)
Last filled #60 05/28/15.  CPE 03/30/15.  Okay to refill?

## 2015-08-31 NOTE — Telephone Encounter (Signed)
Ok to print out and put on my desk for signature. 

## 2015-09-01 MED ORDER — METHYLPHENIDATE HCL 20 MG PO TABS: 20.0000 mg | ORAL_TABLET | Freq: Two times a day (BID) | ORAL | Status: DC

## 2015-09-01 NOTE — Telephone Encounter (Signed)
Lm on pts vm and informed her Rx is available for pickup from the front desk. Pt advised third party unable to pickup  

## 2015-09-02 ENCOUNTER — Encounter: Payer: Self-pay | Admitting: Family Medicine

## 2015-09-21 ENCOUNTER — Encounter: Payer: Self-pay | Admitting: Family Medicine

## 2015-12-02 ENCOUNTER — Other Ambulatory Visit: Payer: Self-pay | Admitting: Family Medicine

## 2016-01-03 ENCOUNTER — Other Ambulatory Visit: Payer: Self-pay

## 2016-01-03 MED ORDER — METHYLPHENIDATE HCL 20 MG PO TABS: 20.0000 mg | ORAL_TABLET | Freq: Two times a day (BID) | ORAL | 0 refills | Status: DC

## 2016-01-03 NOTE — Telephone Encounter (Signed)
Pt left v/m requesting rx ritalin. Call when ready for pick up. Last printed # 60 on 09/01/15. Pt last annual exam on 03/30/15.

## 2016-01-04 MED ORDER — METHYLPHENIDATE HCL 20 MG PO TABS: 20.0000 mg | ORAL_TABLET | Freq: Two times a day (BID) | ORAL | 0 refills | Status: DC

## 2016-01-04 NOTE — Telephone Encounter (Signed)
Lm on pts vm informing her Rx is available for pickup from the front desk 

## 2016-01-04 NOTE — Addendum Note (Signed)
Addended by: Desmond DikeKNIGHT, Gerrica Cygan H on: 01/04/2016 12:10 PM   Modules accepted: Orders

## 2016-01-25 ENCOUNTER — Ambulatory Visit (INDEPENDENT_AMBULATORY_CARE_PROVIDER_SITE_OTHER): Payer: BLUE CROSS/BLUE SHIELD | Admitting: Family Medicine

## 2016-01-25 ENCOUNTER — Encounter: Payer: Self-pay | Admitting: Family Medicine

## 2016-01-25 DIAGNOSIS — K219 Gastro-esophageal reflux disease without esophagitis: Secondary | ICD-10-CM

## 2016-01-25 DIAGNOSIS — I10 Essential (primary) hypertension: Secondary | ICD-10-CM | POA: Insufficient documentation

## 2016-01-25 DIAGNOSIS — R030 Elevated blood-pressure reading, without diagnosis of hypertension: Secondary | ICD-10-CM

## 2016-01-25 LAB — CBC WITH DIFFERENTIAL/PLATELET
BASOS ABS: 0 10*3/uL (ref 0.0–0.1)
Basophils Relative: 0.3 % (ref 0.0–3.0)
EOS ABS: 0.1 10*3/uL (ref 0.0–0.7)
Eosinophils Relative: 1.5 % (ref 0.0–5.0)
HCT: 38.2 % (ref 36.0–46.0)
Hemoglobin: 13.1 g/dL (ref 12.0–15.0)
LYMPHS ABS: 2.8 10*3/uL (ref 0.7–4.0)
Lymphocytes Relative: 28.8 % (ref 12.0–46.0)
MCHC: 34.3 g/dL (ref 30.0–36.0)
MCV: 87.9 fl (ref 78.0–100.0)
Monocytes Absolute: 0.6 10*3/uL (ref 0.1–1.0)
Monocytes Relative: 6.6 % (ref 3.0–12.0)
NEUTROS ABS: 6 10*3/uL (ref 1.4–7.7)
NEUTROS PCT: 62.8 % (ref 43.0–77.0)
PLATELETS: 428 10*3/uL — AB (ref 150.0–400.0)
RBC: 4.35 Mil/uL (ref 3.87–5.11)
RDW: 13.7 % (ref 11.5–15.5)
WBC: 9.6 10*3/uL (ref 4.0–10.5)

## 2016-01-25 LAB — COMPREHENSIVE METABOLIC PANEL
ALT: 15 U/L (ref 0–35)
AST: 16 U/L (ref 0–37)
Albumin: 4.1 g/dL (ref 3.5–5.2)
Alkaline Phosphatase: 88 U/L (ref 39–117)
BILIRUBIN TOTAL: 0.5 mg/dL (ref 0.2–1.2)
BUN: 15 mg/dL (ref 6–23)
CO2: 27 meq/L (ref 19–32)
CREATININE: 0.98 mg/dL (ref 0.40–1.20)
Calcium: 9.4 mg/dL (ref 8.4–10.5)
Chloride: 103 mEq/L (ref 96–112)
GFR: 68.59 mL/min (ref >60.00)
GLUCOSE: 109 mg/dL — AB (ref 70–99)
Potassium: 4 mEq/L (ref 3.5–5.1)
Sodium: 137 mEq/L (ref 135–145)
Total Protein: 7.7 g/dL (ref 6.0–8.3)

## 2016-01-25 LAB — H. PYLORI ANTIBODY, IGG: H Pylori IgG: NEGATIVE

## 2016-01-25 LAB — TSH: TSH: 1.95 u[IU]/mL (ref 0.35–4.50)

## 2016-01-25 MED ORDER — ESOMEPRAZOLE MAGNESIUM 40 MG PO CPDR: 40.0000 mg | DELAYED_RELEASE_CAPSULE | Freq: Every day | ORAL | 3 refills | Status: DC

## 2016-01-25 NOTE — Progress Notes (Signed)
Pre visit review using our clinic review tool, if applicable. No additional management support is needed unless otherwise documented below in the visit note. 

## 2016-01-25 NOTE — Patient Instructions (Signed)
Great to see you.   I will call you with your lab results but please come see me on Tuesday.

## 2016-01-25 NOTE — Assessment & Plan Note (Signed)
New- check labs today to rule out other possible contributing factors. Follow up next Tuesday to recheck BP. If elevated at that time, start low dose antihypertensive. The patient indicates understanding of these issues and agrees with the plan.

## 2016-01-25 NOTE — Progress Notes (Signed)
Subjective:   Patient ID: Anna Burke, female    DOB: 1980-12-09, 35 y.o.   MRN: [id number]  Anna Burke is a pleasant 35 y.o. year old female who presents to clinic today with Hypertension and Gastroesophageal Reflux  on 01/25/2016  HPI:  ?HTN-  Has gained 15 pounds since December despite exericising more often. Feels more flushed and has headaches so has been checking her BP intermittently at grocery store- elevated a few times into 140s- 150s/90s- 100s.  No blurred vision. She thinks that both her mom and dad had high blood pressure.  Reflux is worse lately as well despite taking ranitidine.    Current Outpatient Prescriptions on File Prior to Visit  Medication Sig Dispense Refill  . buPROPion (WELLBUTRIN XL) 300 MG 24 hr tablet TAKE 1 TABLET (300 MG TOTAL) BY MOUTH DAILY. 30 tablet 11  . COLOSTRUM PO Take 2 tablets by mouth daily.    . diazepam (VALIUM) 5 MG tablet TAKE 1-2 TABLETS BY MOUTH DAILY AS NEEDED FOR MUSCLE SPASMS 30 tablet 0  . hydrOXYzine (ATARAX/VISTARIL) 25 MG tablet TAKE 1 TABLET BY MOUTH THREE TIMES DAILY AS NEEDED PAIN 60 tablet 5  . methylphenidate (RITALIN) 20 MG tablet Take 1 tablet (20 mg total) by mouth 2 (two) times daily. 60 tablet 0  . norethindrone-ethinyl estradiol-iron (JUNEL FE 1.5/30) 1.5-30 MG-MCG tablet Take 30 mg by mouth daily.      No current facility-administered medications on file prior to visit.     Allergies  Allergen Reactions  . Sulfa Antibiotics Hives  . Ortho Evra [Norelgestromin-Eth Estradiol] Rash and Other (See Comments)    Elevated BP  . Shellfish Allergy Nausea And Vomiting    Past Medical History:  Diagnosis Date  . Depression   . Insomnia    Sleep study on 09/03/2009 by Dr. Maple Hudson consistent with insomnia but no organic sleep disturbance identified.  . Interstitial cystitis    Dr. Logan Bores [facility]    No past surgical history on file.  Family History  Problem Relation Age of Onset  . Arthritis Mother     . Heart disease Mother   . Arthritis Father     Social History   Social History  . Marital status: Married    Spouse name: N/A  . Number of children: 0  . Years of education: N/A   Occupational History  . umemployed Not Employed   Social History Main Topics  . Smoking status: Never Smoker  . Smokeless tobacco: Never Used  . Alcohol use Yes  . Drug use: No  . Sexual activity: Not on file   Other Topics Concern  . Not on file   Social History Narrative  . No narrative on file   The PMH, PSH, Social History, Family History, Medications, and allergies have been reviewed in [facility], and have been updated if relevant.   Review of Systems  Constitutional: Positive for fatigue and unexpected weight change.  HENT: Negative.   Eyes: Negative.   Respiratory: Negative.   Cardiovascular: Negative.   Gastrointestinal:       +GERD symptoms  Genitourinary: Negative.   Musculoskeletal: Negative.   Allergic/Immunologic: Negative.   Neurological: Negative.   Hematological: Negative.   Psychiatric/Behavioral: Negative.   All other systems reviewed and are negative.      Objective:    BP (!) 146/92   Pulse 87   Temp 98.1 F (36.7 C) (Oral)   Wt 215 lb 8 oz (97.8 kg)   SpO2  98%   BMI 37.87 kg/m   BP Readings from Last 3 Encounters:  01/25/16 (!) 146/92  03/30/15 112/60  09/13/14 128/88   Wt Readings from Last 3 Encounters:  01/25/16 215 lb 8 oz (97.8 kg)  03/30/15 201 lb 8 oz (91.4 kg)  09/13/14 191 lb 12.8 oz (87 kg)     Physical Exam  Constitutional: She is oriented to person, place, and time. She appears well-developed and well-nourished. No distress.  HENT:  Head: Normocephalic.  Eyes: Conjunctivae are normal.  Neck: Neck supple.  Cardiovascular: Normal rate and regular rhythm.   Pulmonary/Chest: Effort normal and breath sounds normal.  Musculoskeletal: Normal range of motion. She exhibits no edema.  Neurological: She is alert and oriented to person,  place, and time. No cranial nerve deficit.  Skin: Skin is warm and dry. She is not diaphoretic.  Psychiatric: She has a normal mood and affect. Her behavior is normal. Thought content normal.  Nursing note and vitals reviewed.         Assessment & Plan:   Gastroesophageal reflux disease, esophagitis presence not specified - Plan: H. pylori antibody, IgG  Elevated blood-pressure reading without diagnosis of hypertension - Plan: TSH, Comprehensive metabolic panel, CBC with Differential/Platelet No Follow-up on file.

## 2016-01-25 NOTE — Assessment & Plan Note (Signed)
Advised to take Nexium as needed for worsening reflux- eRx sent. Check H pylori today.

## 2016-01-30 ENCOUNTER — Ambulatory Visit: Payer: BLUE CROSS/BLUE SHIELD | Admitting: Family Medicine

## 2016-03-16 ENCOUNTER — Other Ambulatory Visit: Payer: Self-pay | Admitting: Family Medicine

## 2016-03-16 ENCOUNTER — Ambulatory Visit (HOSPITAL_COMMUNITY)
Admission: EM | Admit: 2016-03-16 | Discharge: 2016-03-16 | Disposition: A | Discharge status: Home / Self Care | Payer: BLUE CROSS/BLUE SHIELD | Attending: Family Medicine | Admitting: Family Medicine

## 2016-03-16 ENCOUNTER — Encounter (HOSPITAL_COMMUNITY): Payer: Self-pay | Admitting: *Deleted

## 2016-03-16 DIAGNOSIS — M25552 Pain in left hip: Secondary | ICD-10-CM | POA: Diagnosis not present

## 2016-03-16 DIAGNOSIS — S5012XA Contusion of left forearm, initial encounter: Secondary | ICD-10-CM | POA: Diagnosis not present

## 2016-03-16 DIAGNOSIS — S2002XA Contusion of left breast, initial encounter: Secondary | ICD-10-CM | POA: Diagnosis not present

## 2016-03-16 MED ORDER — TRAMADOL HCL 50 MG PO TABS: 50.0000 mg | ORAL_TABLET | Freq: Four times a day (QID) | ORAL | 0 refills | Status: DC | PRN

## 2016-03-16 MED ORDER — CYCLOBENZAPRINE HCL 10 MG PO TABS: 10.0000 mg | ORAL_TABLET | Freq: Two times a day (BID) | ORAL | 0 refills | Status: DC | PRN

## 2016-03-16 MED ORDER — IBUPROFEN 600 MG PO TABS: 600.0000 mg | ORAL_TABLET | Freq: Four times a day (QID) | ORAL | 0 refills | Status: DC | PRN
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
## 2016-03-16 NOTE — Discharge Instructions (Signed)
°  Tramadol is strong pain medication. While taking, do not drink alcohol, drive, or perform any other activities that requires focus while taking these medications.  ° °Flexeril is a muscle relaxer and may cause drowsiness. Do not drink alcohol, drive, or operate heavy machinery while taking. ° °
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
## 2016-04-10 ENCOUNTER — Other Ambulatory Visit: Payer: Self-pay | Admitting: Family Medicine

## 2016-05-02 ENCOUNTER — Other Ambulatory Visit: Payer: Self-pay | Admitting: *Deleted

## 2016-05-02 MED ORDER — BUPROPION HCL ER (XL) 300 MG PO TB24: ORAL_TABLET | ORAL | 0 refills | Status: DC

## 2016-05-02 MED ORDER — NORETHIN ACE-ETH ESTRAD-FE 1-20 MG-MCG PO TABS: 1.0000 | ORAL_TABLET | Freq: Every day | ORAL | 0 refills | Status: DC

## 2016-05-02 NOTE — Addendum Note (Signed)
Addended by: Desmond DikeKNIGHT, Demon Volante H on: 05/02/2016 04:33 PM   Modules accepted: Orders

## 2016-05-03 ENCOUNTER — Other Ambulatory Visit: Payer: Self-pay | Admitting: Family Medicine

## 2016-05-18 ENCOUNTER — Other Ambulatory Visit: Payer: Self-pay

## 2016-05-18 NOTE — Telephone Encounter (Signed)
Ok to print out and leave on my desk for signature. 

## 2016-05-18 NOTE — Telephone Encounter (Signed)
Pt left v/m requesting rx for ritalin. Call when ready for pick up. Last printed #60 on 01/04/16. Last annual 03/30/15 and BP check on 01/25/16.Please advise. No future appt scheduled.

## 2016-05-21 MED ORDER — METHYLPHENIDATE HCL 20 MG PO TABS: 20.0000 mg | ORAL_TABLET | Freq: Two times a day (BID) | ORAL | 0 refills | Status: DC

## 2016-05-21 NOTE — Telephone Encounter (Signed)
Pt advised via carrie Rx is available for pickup from the front desk. Pt advised third party unable to pickp

## 2016-05-29 ENCOUNTER — Encounter: Payer: Self-pay | Admitting: Family Medicine

## 2016-06-06 ENCOUNTER — Other Ambulatory Visit: Payer: Self-pay | Admitting: Family Medicine

## 2016-06-06 NOTE — Telephone Encounter (Signed)
Last refill 05/02/16, was advised that she needs CPE for further refills, last OV 01/25/16 OK TO REFILL?

## 2016-06-08 ENCOUNTER — Other Ambulatory Visit: Payer: Self-pay | Admitting: Family Medicine

## 2016-06-11 ENCOUNTER — Other Ambulatory Visit: Payer: Self-pay | Admitting: Family Medicine

## 2016-06-11 NOTE — Telephone Encounter (Signed)
TELEPHONE ADVICE RECORD [facility]  Patient Name: Anna AltLIZABETH LAIRD-JOY  DOB: 06/17/1980    Initial Comment Caller says she is coughing a lot and she is needing to get a refill on her cough syrup medication (Hydrocodone/ Chlorphener suspension). 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   Nurse Assessment      Guidelines    Guideline Title Affirmed Question Affirmed Notes       Final Disposition User   FINAL ATTEMPT MADE - message left Tillman AbideSlagle, Charity fundraiserN, Stanton Kidneyebra

## 2016-06-12 NOTE — Telephone Encounter (Signed)
Left detailed message on vm per dpr that she would need an OV before we can fill this med.

## 2016-06-12 NOTE — Telephone Encounter (Signed)
Pt request thru [facility] rx tussionex; call when ready for pick up. Last printed # 140 ml on 05/18/15. Last acute visit 01/25/16 and last annual exam on 03/30/15. Please advise.

## 2016-07-20 ENCOUNTER — Telehealth: Payer: Self-pay

## 2016-07-20 NOTE — Telephone Encounter (Signed)
Pt left v/m; due to ins pt changed from [facility] to [facility]. Now pt cannot get BC pill every 3 weeks [facility] Pt said she needs to get q3wks to prevent IC from flairing. Pt request BC pill quantity changed to q 3 weeks. Please advise. Pt request cb.(Dr Dayton Martes out of office and limited access to computers).

## 2016-07-20 NOTE — Telephone Encounter (Signed)
Please confirm with pharmacist that she is not taking the placebo week and needs refills every 3 weeks

## 2016-07-20 NOTE — Telephone Encounter (Signed)
I spoke to [place] at the pharmacy. She said it went through insurance today and she should be able to get it. If she truly is only taking the 1st 3 weeks of pills, we need to send a new rx stating that. Will forward to Dr Dayton Martes to take care of on Monday.

## 2016-08-07 ENCOUNTER — Other Ambulatory Visit: Payer: Self-pay | Admitting: Family Medicine

## 2016-08-12 ENCOUNTER — Other Ambulatory Visit: Payer: Self-pay | Admitting: Family Medicine

## 2016-09-20 ENCOUNTER — Other Ambulatory Visit: Payer: Self-pay

## 2016-09-20 NOTE — Telephone Encounter (Signed)
Ok to print out and place in my box for signature.  Needs OV for further refills.

## 2016-09-20 NOTE — Telephone Encounter (Signed)
Pt left v/m requesting rx methylphenidate. Call when ready for pick up. Last printed # 60 on 05/21/16. Last seen 01/25/16; last annual 03/30/15. No future appt scheduled.

## 2016-09-21 NOTE — Telephone Encounter (Signed)
Will print on Monday

## 2016-09-24 MED ORDER — METHYLPHENIDATE HCL 20 MG PO TABS: 20.0000 mg | ORAL_TABLET | Freq: Two times a day (BID) | ORAL | 0 refills | Status: DC

## 2016-09-24 NOTE — Telephone Encounter (Signed)
LMOM informing Pt that Rx is at front desk for pick up.

## 2016-10-05 ENCOUNTER — Other Ambulatory Visit: Payer: Self-pay | Admitting: Family Medicine

## 2016-10-08 NOTE — Telephone Encounter (Signed)
Last refill 08/13/16 Last OV 01/25/16  Ok to refill?

## 2016-10-18 ENCOUNTER — Ambulatory Visit (INDEPENDENT_AMBULATORY_CARE_PROVIDER_SITE_OTHER): Payer: BLUE CROSS/BLUE SHIELD | Admitting: Family Medicine

## 2016-10-18 ENCOUNTER — Ambulatory Visit: Payer: BLUE CROSS/BLUE SHIELD | Admitting: Family Medicine

## 2016-10-18 ENCOUNTER — Encounter: Payer: Self-pay | Admitting: Family Medicine

## 2016-10-18 VITALS — BP 110/78 | HR 102 | Temp 98.6°F | Wt 215.5 lb

## 2016-10-18 DIAGNOSIS — R300 Dysuria: Secondary | ICD-10-CM | POA: Diagnosis not present

## 2016-10-18 LAB — POC URINALSYSI DIPSTICK (AUTOMATED)
BILIRUBIN UA: NEGATIVE
Blood, UA: NEGATIVE
GLUCOSE UA: NEGATIVE
KETONES UA: NEGATIVE
Leukocytes, UA: NEGATIVE
Nitrite, UA: NEGATIVE
PROTEIN UA: NEGATIVE
Urobilinogen, UA: 0.2 E.U./dL
pH, UA: 6 (ref 5.0–8.0)

## 2016-10-18 NOTE — Patient Instructions (Signed)
We'll contact you with your lab report. Plenty of fluids in the meantime.  Take care.  Glad to see you.  If the urine culture is negative and you continue to have symptoms then I would follow up with urology. I'll notify your PCP about the diazepam request.

## 2016-10-18 NOTE — Progress Notes (Signed)
The following is correct to the best of my knowledge and ability. It was difficult to get a straight answer from the patient. I had asked her repeatedly "you have any discomfort at all with urination?" before I could get a straight answer, which is apparently "yes".  H/o IC.  She has had sx for the last few weeks.  She didn't know if she had UTI.   Dysuria: some discomfort with urination.   duration of symptoms:a few weeks.   abdominal pain: lower abd pain, lower midline and lower left sided pain fevers:no back pain: some left lower back pain.   vomiting:no  She asked about a refill on her BZD and I'll defer to PCP. [facility] [address]    Meds, vitals, and allergies reviewed.   Per HPI unless specifically indicated in ROS section   GEN: nad, alert and oriented HEENT: mucous membranes moist NECK: supple CV: rrr.  PULM: ctab, no inc wob ABD: soft, +bs, suprapubic and LLQ area tender EXT: no edema No CVA pain

## 2016-10-19 DIAGNOSIS — R300 Dysuria: Secondary | ICD-10-CM | POA: Insufficient documentation

## 2016-10-19 NOTE — Assessment & Plan Note (Signed)
She asked about a refill on her BZD and I'll defer to PCP. [facility] [address].    Unclear if she has a flare of interstitial cystitis versus a UTI. Discussed with patient. Check urine culture. No antibiotics at this point. Okay for outpatient follow-up. Nontoxic. She does not have other alarming symptoms like blood in her stool that would suggest a GI tract issue. It is more likely that she has symptoms arising from her bladder. Discussed.

## 2016-10-20 LAB — URINE CULTURE

## 2016-10-31 ENCOUNTER — Ambulatory Visit: Payer: BLUE CROSS/BLUE SHIELD | Admitting: Family Medicine

## 2016-10-31 DIAGNOSIS — Z0289 Encounter for other administrative examinations: Secondary | ICD-10-CM

## 2016-11-17 DIAGNOSIS — Z7689 Persons encountering health services in other specified circumstances: Secondary | ICD-10-CM

## 2016-11-22 ENCOUNTER — Telehealth: Payer: Self-pay | Admitting: Family Medicine

## 2016-11-22 NOTE — Telephone Encounter (Signed)
Pt called she is going to drop of FMLA paperwork This is form interstitial cystitis  To start 7/30 Out of work 7/30 and 7/31  intermittent 1 month 2 days at a time Wants to know if she could get a hour or two when flare up to help with cramping  Dr appointment 1 once every 6 month  Duration 4 hours @ time

## 2016-11-23 NOTE — Telephone Encounter (Signed)
Pt dropped off FMLA ppw to be filled out. I placed in blue folder on Robin's desk. °

## 2016-11-27 NOTE — Telephone Encounter (Signed)
Dr Dayton Martes Do you want pt to come in to talk to you about her FMLA Last visit with dr Para March 10/18/16 Last time you saw her was 01/25/16

## 2016-11-27 NOTE — Telephone Encounter (Signed)
This has been a chronic issue for her.  Okay to fill out this time without an OV.

## 2016-11-27 NOTE — Telephone Encounter (Signed)
fmla in dr aron's in box Please see Question 14

## 2016-12-03 NOTE — Telephone Encounter (Signed)
Paperwork faxed 11/27/16 Left message letting pt know paperwork had been faxed 11/27/16  Copy for pt Copy for file Copy for scan Copy for billing

## 2016-12-05 ENCOUNTER — Other Ambulatory Visit: Payer: Self-pay | Admitting: Family Medicine

## 2017-01-04 ENCOUNTER — Other Ambulatory Visit: Payer: Self-pay

## 2017-01-04 MED ORDER — DIAZEPAM 5 MG PO TABS: ORAL_TABLET | ORAL | 0 refills | Status: DC

## 2017-01-04 MED ORDER — METHYLPHENIDATE HCL 20 MG PO TABS: 20.0000 mg | ORAL_TABLET | Freq: Two times a day (BID) | ORAL | 0 refills | Status: DC

## 2017-01-04 NOTE — Telephone Encounter (Signed)
Pt left v/m requesting rx methylphenidate 20 mg( last printed # 60 on 09/24/16) and diazepam (last refilled # 30 on 04/05/2015). Pt last seen 01/25/16 and last annual 03/30/2015. No future appt scheduled.

## 2017-01-04 NOTE — Telephone Encounter (Signed)
Printed and placed on Dr. Elmer Sow desk.

## 2017-01-04 NOTE — Telephone Encounter (Signed)
Okay to print out and place on my desk for signature. 

## 2017-01-07 NOTE — Telephone Encounter (Addendum)
Script placed up front for pick up, Left voice mail advising ready to pick up.

## 2017-01-13 ENCOUNTER — Other Ambulatory Visit: Payer: Self-pay | Admitting: Family Medicine

## 2017-01-14 NOTE — Telephone Encounter (Signed)
Last refill 08/07/16 #30 +4  Last OV 10/18/16 with Dr. Para March. Ok to refill?

## 2017-02-15 ENCOUNTER — Other Ambulatory Visit: Payer: Self-pay | Admitting: Family Medicine

## 2017-03-07 ENCOUNTER — Other Ambulatory Visit: Payer: Self-pay

## 2017-03-07 MED ORDER — NORETHIN ACE-ETH ESTRAD-FE 1-20 MG-MCG PO TABS: 1.0000 | ORAL_TABLET | Freq: Every day | ORAL | 0 refills | Status: DC

## 2017-03-13 ENCOUNTER — Other Ambulatory Visit: Payer: Self-pay | Admitting: Internal Medicine

## 2017-03-13 ENCOUNTER — Other Ambulatory Visit: Payer: Self-pay | Admitting: Family Medicine

## 2017-03-14 ENCOUNTER — Telehealth: Payer: Self-pay | Admitting: Family Medicine

## 2017-03-14 NOTE — Telephone Encounter (Signed)
Copied from CRM [phone]. Topic: Quick Communication - See Telephone Encounter >> Mar 14, 2017 12:15 PM Arlyss Gandyichardson, Sharlize Hoar N, NT wrote: CRM for notification. See Telephone encounter for: Pt needs a refill of her Wellbutrin and hydroxyzine. [facility] [address]. [place] is leaving the country on Saturday if this can be filled before then.  03/14/17.

## 2017-03-15 NOTE — Telephone Encounter (Signed)
Was advised in Nov to sched appt for future fills/thx dmf

## 2017-03-15 NOTE — Telephone Encounter (Signed)
Was advised in Nov to sched appt for future fills/thx dmf 

## 2017-03-15 NOTE — Telephone Encounter (Signed)
Hydroxyzine refill. Last OV 10/18/16. Last refill on 12/06/16.

## 2017-03-21 NOTE — Telephone Encounter (Signed)
Please let her know that I really can only refill this one time only unless she is seen again.

## 2017-03-21 NOTE — Telephone Encounter (Signed)
TA-Pt has not been seen by you since 10.2017 then no-showed with Dr. Lavada Mesiuncan/she is requesting refills of wellbutrin & Hydroxyzine/plz advise/thx dmf

## 2017-03-22 MED ORDER — HYDROXYZINE HCL 25 MG PO TABS: ORAL_TABLET | ORAL | 0 refills | Status: DC

## 2017-03-22 NOTE — Telephone Encounter (Signed)
Per TA sent 30d to pharm/note stating no future fills till seen/thx dmf

## 2017-03-28 ENCOUNTER — Other Ambulatory Visit: Payer: Self-pay | Admitting: Family Medicine

## 2017-03-28 ENCOUNTER — Other Ambulatory Visit: Payer: Self-pay | Admitting: Internal Medicine

## 2017-03-28 NOTE — Telephone Encounter (Signed)
Copied from CRM [phone]. Topic: Quick Communication - Rx Refill/Question >> Mar 28, 2017  2:18 PM Oneal GroutSebastian, Jennifer S wrote: Has the patient contacted their pharmacy? Yes.     (Agent: If no, request that the patient contact the pharmacy for the refill.)   Preferred Pharm[facility] (with phone number [facility] [address]   Agent: Please be advised that RX refills may take up to 3 business days. We ask that you follow-up with your pharmacy. States pharmacy has yet to received prescription, was to be called in on 03/22/17, buPROPion (WELLBUTRIN XL) 300 MG 24 hr tablet

## 2017-03-29 ENCOUNTER — Other Ambulatory Visit: Payer: Self-pay

## 2017-03-29 NOTE — Telephone Encounter (Signed)
Left message pt. Needs to schedule appointment before refills can be done.

## 2017-04-25 ENCOUNTER — Telehealth: Payer: Self-pay | Admitting: Family Medicine

## 2017-04-25 NOTE — Telephone Encounter (Signed)
Copied from CRM [phone]. Topic: Quick Communication - Rx Refill/Question >> Apr 25, 2017  3:30 PM Landry MellowFoltz, Melissa J wrote: Medication: buPROPion (WELLBUTRIN XL) 300 MG 24 hr tablet Pt has made appt for med refill on 05/01/17. She is asking for just enough to get through   Has the patient contacted their pharmacy? No. No refills left    (Agent: If no, request that the patient contact the pharmacy for the refill.)   Preferred Pharm[facility] ([facility] phone number [facility] [address]    Agent: Please be advised that RX refills may take up to 3 business days. We ask that you follow-up with your pharmacy.

## 2017-04-25 NOTE — Telephone Encounter (Signed)
Medication: buPROPion (WELLBUTRIN XL) 300 MG 24 hr tablet Pt has made appt for med refill on 05/01/17. She is asking for just enough to get through

## 2017-04-26 ENCOUNTER — Other Ambulatory Visit: Payer: Self-pay

## 2017-04-26 MED ORDER — BUPROPION HCL ER (XL) 300 MG PO TB24: 300.0000 mg | ORAL_TABLET | Freq: Every day | ORAL | 0 refills | Status: DC

## 2017-04-26 NOTE — Telephone Encounter (Signed)
Faxed Rx for #10 only/thx dmf

## 2017-05-01 ENCOUNTER — Ambulatory Visit: Payer: BLUE CROSS/BLUE SHIELD | Admitting: Family Medicine

## 2017-05-02 ENCOUNTER — Ambulatory Visit: Payer: BLUE CROSS/BLUE SHIELD | Admitting: Family Medicine

## 2017-05-07 ENCOUNTER — Ambulatory Visit: Payer: BLUE CROSS/BLUE SHIELD | Admitting: Family Medicine

## 2017-05-07 ENCOUNTER — Encounter: Payer: Self-pay | Admitting: Family Medicine

## 2017-05-07 VITALS — BP 116/80 | HR 97 | Temp 98.6°F | Ht 63.5 in | Wt 215.8 lb

## 2017-05-07 DIAGNOSIS — N3010 Interstitial cystitis (chronic) without hematuria: Secondary | ICD-10-CM

## 2017-05-07 DIAGNOSIS — Z79899 Other long term (current) drug therapy: Secondary | ICD-10-CM | POA: Diagnosis not present

## 2017-05-07 DIAGNOSIS — R5382 Chronic fatigue, unspecified: Secondary | ICD-10-CM | POA: Diagnosis not present

## 2017-05-07 DIAGNOSIS — J189 Pneumonia, unspecified organism: Secondary | ICD-10-CM

## 2017-05-07 DIAGNOSIS — F32A Depression, unspecified: Secondary | ICD-10-CM

## 2017-05-07 DIAGNOSIS — K219 Gastro-esophageal reflux disease without esophagitis: Secondary | ICD-10-CM

## 2017-05-07 DIAGNOSIS — F329 Major depressive disorder, single episode, unspecified: Secondary | ICD-10-CM

## 2017-05-07 MED ORDER — METHYLPHENIDATE HCL 20 MG PO TABS: 20.0000 mg | ORAL_TABLET | Freq: Two times a day (BID) | ORAL | 0 refills | Status: DC

## 2017-05-07 MED ORDER — ESOMEPRAZOLE MAGNESIUM 40 MG PO CPDR: 40.0000 mg | DELAYED_RELEASE_CAPSULE | Freq: Every day | ORAL | 1 refills | Status: DC

## 2017-05-07 MED ORDER — BUPROPION HCL ER (XL) 300 MG PO TB24: 300.0000 mg | ORAL_TABLET | Freq: Every day | ORAL | 1 refills | Status: DC

## 2017-05-07 MED ORDER — NORETHIN ACE-ETH ESTRAD-FE 1-20 MG-MCG PO TABS: 1.0000 | ORAL_TABLET | Freq: Every day | ORAL | 1 refills | Status: DC

## 2017-05-07 MED ORDER — HYDROXYZINE HCL 25 MG PO TABS: ORAL_TABLET | ORAL | 1 refills | Status: DC

## 2017-05-07 MED ORDER — DIAZEPAM 5 MG PO TABS: ORAL_TABLET | ORAL | 2 refills | Status: DC

## 2017-05-07 MED ORDER — DOXYCYCLINE HYCLATE 100 MG PO TABS: 100.0000 mg | ORAL_TABLET | Freq: Two times a day (BID) | ORAL | 0 refills | Status: DC

## 2017-05-07 MED ORDER — HYDROCODONE-HOMATROPINE 5-1.5 MG/5ML PO SYRP: 5.0000 mL | ORAL_SOLUTION | Freq: Three times a day (TID) | ORAL | 0 refills | Status: DC | PRN

## 2017-05-07 NOTE — Progress Notes (Signed)
Subjective:   Patient ID: Anna Burke, female    DOB: [birth year], 37 y.o.   MRN: [id number]  Anna Prestolizabeth C Harbor is a pleasant 37 y.o. year old female who presents to clinic today with Medication Refill (Patient is here today to F/U with medications and get refills.  She states that she is doing well with current medication regimen and would like all Rx's [facility].) and Cough (Patient is also C/O a cough since week before Christmas.  Cough is now productive with yellow sputum.)  on 05/07/2017  HPI:  Cough- now productive of yellow sputum.  Ongoing since 12/25. Feeling more short of breath.  Not sleeping well because of cough.  Intermittent chills.  She is pleased with current OCP.  Periods are light.  Depression- has been stable on Wellbutrin 300 mg XL daily.  Chronic fatigue- has been stable on as needed Ritalin.  Does not refill monthly.  IC-   Food triggers have been the biggest issue.     Current Outpatient Medications on File Prior to Visit  Medication Sig Dispense Refill  . COLOSTRUM PO Take 2 tablets by mouth daily.     No current facility-administered medications on file prior to visit.     Allergies  Allergen Reactions  . Sulfa Antibiotics Hives  . Ortho Evra [Norelgestromin-Eth Estradiol] Rash and Other (See Comments)    Elevated BP  . Shellfish Allergy Nausea And Vomiting    Past Medical History:  Diagnosis Date  . Depression   . Insomnia    Sleep study on 09/03/2009 by Dr. Maple HudsonYoung consistent with insomnia but no organic sleep disturbance identified.  . Interstitial cystitis    [facility]    No past surgical history on file.  Family History  Problem Relation Age of Onset  . Arthritis Mother   . Heart disease Mother   . Arthritis Father     Social History   Socioeconomic History  . Marital status: Married    Spouse name: Not on file  . Number of children: 0  . Years of education: Not on file  . Highest education level:  Not on file  Social Needs  . Financial resource strain: Not on file  . Food insecurity - worry: Not on file  . Food insecurity - inability: Not on file  . Transportation needs - medical: Not on file  . Transportation needs - non-medical: Not on file  Occupational History  . Occupation: Chiropractorumemployed    Employer: NOT EMPLOYED  Tobacco Use  . Smoking status: Never Smoker  . Smokeless tobacco: Never Used  Substance and Sexual Activity  . Alcohol use: Yes  . Drug use: No  . Sexual activity: Not on file  Other Topics Concern  . Not on file  Social History Narrative  . Not on file   The PMH, PSH, Social History, Family History, Medications, and allergies [facility], and have been updated if relevant.   Review of Systems  Constitutional: Positive for chills.  Eyes: Negative.   Respiratory: Positive for cough, shortness of breath and wheezing. Negative for choking, chest tightness and stridor.   Cardiovascular: Negative.   Endocrine: Negative.   Genitourinary: Negative.   Musculoskeletal: Negative.   Skin: Negative.   Allergic/Immunologic: Negative.   Neurological: Negative.   Hematological: Negative.   Psychiatric/Behavioral: Negative.   All other systems reviewed and are negative.      Objective:    BP 116/80 (BP Location:  Left Arm, Patient Position: Sitting, Cuff Size: Normal)   Pulse 97   Temp 98.6 F (37 C) (Oral)   Ht 5' 3.5" (1.613 m)   Wt 215 lb 12.8 oz (97.9 kg)   SpO2 97%   BMI 37.63 kg/m    Physical Exam  Constitutional: She is oriented to person, place, and time. She appears well-developed and well-nourished. No distress.  HENT:  Head: Normocephalic and atraumatic.  Eyes: Conjunctivae are normal.  Neck: Normal range of motion. Neck supple.  Cardiovascular: Normal rate and regular rhythm.  Pulmonary/Chest: She has wheezes. She has rales.  Musculoskeletal: Normal range of motion.  Neurological: She is alert and oriented to person, place,  and time. No cranial nerve deficit.  Skin: Skin is warm and dry. She is not diaphoretic.  Psychiatric: She has a normal mood and affect. Her behavior is normal. Judgment and thought content normal.  Nursing note and vitals reviewed.         Assessment & Plan:   Encounter for long-term (current) use of high-risk medication - Plan: Pain Mgmt, Profile 8 w/Conf, U  Community acquired pneumonia, unspecified laterality  Chronic fatigue  Depression, unspecified depression type  Interstitial cystitis  Gastroesophageal reflux disease, esophagitis presence not specified No Follow-up on file.

## 2017-05-07 NOTE — Assessment & Plan Note (Signed)
Well controlled with Wellbutrin. No changes made.

## 2017-05-07 NOTE — Assessment & Plan Note (Signed)
Well contrlled. Ritalin rx refilled. UDS today.

## 2017-05-07 NOTE — Assessment & Plan Note (Signed)
New- Given duration and progression of symptoms, will treat for bacterial CAP with 7 day course of doxycycline 100 mg twice daily x days, hycodan as needed for severe cough.

## 2017-05-07 NOTE — Assessment & Plan Note (Signed)
Well controlled with PPI

## 2017-05-07 NOTE — Patient Instructions (Signed)
Great to see you. Please take doxycyline 100 mg twice daily for 7 days.

## 2017-05-11 LAB — PAIN MGMT, PROFILE 8 W/CONF, U
6 Acetylmorphine: NEGATIVE ng/mL (ref <10)
ALPHAHYDROXYALPRAZOLAM: NEGATIVE ng/mL (ref <25)
ALPHAHYDROXYTRIAZOLAM: NEGATIVE ng/mL (ref <50)
AMPHETAMINES: NEGATIVE ng/mL (ref <500)
Alcohol Metabolites: NEGATIVE ng/mL (ref <500)
Alphahydroxymidazolam: NEGATIVE ng/mL (ref <50)
Aminoclonazepam: NEGATIVE ng/mL (ref <25)
Amphetamine: NEGATIVE ng/mL (ref <250)
BENZODIAZEPINES: NEGATIVE ng/mL (ref <100)
Buprenorphine, Urine: NEGATIVE ng/mL (ref <5)
COCAINE METABOLITE: NEGATIVE ng/mL (ref <150)
Creatinine: 185.1 mg/dL
HYDROXYETHYLFLURAZEPAM: NEGATIVE ng/mL (ref <50)
LORAZEPAM: NEGATIVE ng/mL (ref <50)
MARIJUANA METABOLITE: NEGATIVE ng/mL (ref <20)
MDMA: NEGATIVE ng/mL (ref <500)
Methamphetamine: NEGATIVE ng/mL (ref <250)
NORDIAZEPAM: NEGATIVE ng/mL (ref <50)
OXIDANT: NEGATIVE ug/mL (ref <200)
Opiates: NEGATIVE ng/mL (ref <100)
Oxazepam: NEGATIVE ng/mL (ref <50)
Oxycodone: NEGATIVE ng/mL (ref <100)
Temazepam: NEGATIVE ng/mL (ref <50)
pH: 6.25 (ref 4.5–9.0)

## 2017-06-11 ENCOUNTER — Ambulatory Visit: Payer: BLUE CROSS/BLUE SHIELD | Admitting: Family Medicine

## 2017-06-11 ENCOUNTER — Encounter: Payer: Self-pay | Admitting: Family Medicine

## 2017-06-11 VITALS — BP 172/92 | HR 113 | Temp 98.3°F | Ht 63.5 in | Wt 210.6 lb

## 2017-06-11 DIAGNOSIS — J189 Pneumonia, unspecified organism: Secondary | ICD-10-CM

## 2017-06-11 MED ORDER — CEFTRIAXONE SODIUM 500 MG IJ SOLR
500.0000 mg | Freq: Once | INTRAMUSCULAR | Status: AC
  Administered 2017-06-11: 500 mg via INTRAMUSCULAR

## 2017-06-11 MED ORDER — HYDROCOD POLST-CPM POLST ER 10-8 MG/5ML PO SUER: 5.0000 mL | Freq: Two times a day (BID) | ORAL | 0 refills | Status: DC | PRN

## 2017-06-11 MED ORDER — FLUCONAZOLE 150 MG PO TABS: 150.0000 mg | ORAL_TABLET | Freq: Once | ORAL | 0 refills | Status: AC

## 2017-06-11 MED ORDER — AZITHROMYCIN 250 MG PO TABS: ORAL_TABLET | ORAL | 0 refills | Status: DC

## 2017-06-11 NOTE — Assessment & Plan Note (Signed)
Concerning for worsening infection. IM CTX given today, sent home with zpack orally. She does not appear acutely ill, okay for outpatient management but did advise going to the ER if symptoms worsen. The patient indicates understanding of these issues and agrees with the plan.

## 2017-06-11 NOTE — Progress Notes (Signed)
Subjective:   Patient ID: Anna Burke, female    DOB: [birth year], 37 y.o.   MRN: [id number]  Anna Prestolizabeth C Vangieson is a pleasant 37 y.o. year old female who presents to clinic today with Cough (Patient is here today C/O a productive cough which is clear to yellow sputum. Her skin is clammy. She was Dx with Pneumonia on 1.29.19 and Sx never completely went away.  Her right side is hurting now which she feels is from the coughing.  She is worried that the one round of abx may not have completely gotten rid of the infection.)  on 06/11/2017  HPI:  Treated for CAP on 05/07/17 with 7 day course of doxycyline twice daily. Initially she felt better but symptoms never truly resolved.  Now she feels feverish, cough is more productive, right side of her chest is hurting.   Current Outpatient Medications on File Prior to Visit  Medication Sig Dispense Refill  . buPROPion (WELLBUTRIN XL) 300 MG 24 hr tablet Take 1 tablet (300 mg total) by mouth daily. 90 tablet 1  . COLOSTRUM PO Take 2 tablets by mouth daily.    . diazepam (VALIUM) 5 MG tablet TAKE 1-2 TABLETS BY MOUTH DAILY AS NEEDED FOR MUSCLE SPASMS 30 tablet 2  . esomeprazole (NEXIUM) 40 MG capsule Take 1 capsule (40 mg total) by mouth daily. 90 capsule 1  . hydrOXYzine (ATARAX/VISTARIL) 25 MG tablet Take 1 tid 180 tablet 1  . methylphenidate (RITALIN) 20 MG tablet Take 1 tablet (20 mg total) by mouth 2 (two) times daily. 60 tablet 0  . norethindrone-ethinyl estradiol (JUNEL FE 1/20) 1-20 MG-MCG tablet Take 1 tablet by mouth daily. 84 tablet 1   No current facility-administered medications on file prior to visit.     Allergies  Allergen Reactions  . Sulfa Antibiotics Hives  . Ortho Evra [Norelgestromin-Eth Estradiol] Rash and Other (See Comments)    Elevated BP  . Shellfish Allergy Nausea And Vomiting    Past Medical History:  Diagnosis Date  . Depression   . Insomnia    Sleep study on 09/03/2009 by Dr. Maple HudsonYoung consistent with  insomnia but no organic sleep disturbance identified.  . Interstitial cystitis    [facility]    No past surgical history on file.  Family History  Problem Relation Age of Onset  . Arthritis Mother   . Heart disease Mother   . Arthritis Father     Social History   Socioeconomic History  . Marital status: Married    Spouse name: Not on file  . Number of children: 0  . Years of education: Not on file  . Highest education level: Not on file  Social Needs  . Financial resource strain: Not on file  . Food insecurity - worry: Not on file  . Food insecurity - inability: Not on file  . Transportation needs - medical: Not on file  . Transportation needs - non-medical: Not on file  Occupational History  . Occupation: Chiropractorumemployed    Employer: NOT EMPLOYED  Tobacco Use  . Smoking status: Never Smoker  . Smokeless tobacco: Never Used  Substance and Sexual Activity  . Alcohol use: Yes  . Drug use: No  . Sexual activity: Not on file  Other Topics Concern  . Not on file  Social History Narrative  . Not on file   The PMH, PSH, Social History, Family History, Medications, and allergies have been [facility], and have been updated if relevant.  Review of Systems  Constitutional: Positive for fatigue and fever.  Respiratory: Positive for cough, shortness of breath and wheezing. Negative for choking, chest tightness and stridor.   Cardiovascular: Positive for chest pain. Negative for palpitations and leg swelling.  Gastrointestinal: Negative.   Endocrine: Negative.   Genitourinary: Negative.   Musculoskeletal: Negative.   Neurological: Negative.   Hematological: Negative.   Psychiatric/Behavioral: Negative.   All other systems reviewed and are negative.      Objective:    BP (!) 172/92 (BP Location: Right Arm, Patient Position: Sitting, Cuff Size: Normal)   Pulse (!) 113   Temp 98.3 F (36.8 C) (Oral)   Ht 5' 3.5" (1.613 m)   Wt 210 lb 9.6 oz (95.5 kg)   SpO2 96%    BMI 36.72 kg/m   BP Readings from Last 3 Encounters:  06/11/17 (!) 172/92  05/07/17 116/80  10/18/16 110/78    Physical Exam  Constitutional: She is oriented to person, place, and time. She appears well-developed and well-nourished. No distress.  HENT:  Head: Normocephalic and atraumatic.  Eyes: Conjunctivae are normal.  Cardiovascular: Regular rhythm.  Pulmonary/Chest: Effort normal. She has rales.  Musculoskeletal: Normal range of motion. She exhibits no edema.  Neurological: She is alert and oriented to person, place, and time. No cranial nerve deficit.  Skin: Skin is warm. She is not diaphoretic.  Psychiatric: She has a normal mood and affect. Her behavior is normal. Judgment and thought content normal.  Nursing note and vitals reviewed.         Assessment & Plan:   Community acquired pneumonia of right lung, unspecified part of lung - Plan: cefTRIAXone (ROCEPHIN) injection 500 mg No Follow-up on file.

## 2017-06-11 NOTE — Patient Instructions (Signed)
Great to see you. Please take zpack as directed.  Tussionex as needed for severe cough.

## 2017-09-07 ENCOUNTER — Other Ambulatory Visit: Payer: Self-pay | Admitting: Family Medicine

## 2017-09-13 ENCOUNTER — Telehealth: Payer: Self-pay | Admitting: Family Medicine

## 2017-09-13 NOTE — Telephone Encounter (Signed)
Spoke with the pt, she said she was sick from 06/1-06/4 with a cold,  she is feeling better now but he work need an Transport plannerMLA fill out. Offer an appt Saturday and Monday but the pt said she has to work (cant come in until 06/12). Pt is hoping Dr. Dayton MartesAron will fill this out without her coming in Please advise. Pt is aware Dr. Dayton MartesAron is not in the office.      Copied from CRM [id number]. Topic: Inquiry >> Sep 13, 2017  3:17 PM Eston Mouldavis, Cheri B wrote: Reason for CRM: Pt has been out of work sick for Sat - Tues.   And she wants to know if she can get a paper  filled our to take back to work or does she need an apt?

## 2017-09-13 NOTE — Telephone Encounter (Signed)
Left vm for the pt to call back, need to offer an appt for the pt.

## 2017-09-13 NOTE — Telephone Encounter (Signed)
I am sorry but I cannot fill out FMLA paperwork for being out of work for an acute issue without an appointment.

## 2017-09-17 NOTE — Telephone Encounter (Signed)
Left another vm for the pt to call back.  

## 2017-09-26 ENCOUNTER — Ambulatory Visit: Payer: BLUE CROSS/BLUE SHIELD | Admitting: Family Medicine

## 2017-09-26 ENCOUNTER — Other Ambulatory Visit: Payer: Self-pay | Admitting: Family Medicine

## 2017-09-26 VITALS — BP 140/96 | HR 111 | Temp 99.3°F | Ht 63.5 in | Wt 218.0 lb

## 2017-09-26 DIAGNOSIS — R05 Cough: Secondary | ICD-10-CM

## 2017-09-26 DIAGNOSIS — Z23 Encounter for immunization: Secondary | ICD-10-CM

## 2017-09-26 DIAGNOSIS — R053 Chronic cough: Secondary | ICD-10-CM | POA: Insufficient documentation

## 2017-09-26 DIAGNOSIS — K219 Gastro-esophageal reflux disease without esophagitis: Secondary | ICD-10-CM

## 2017-09-26 MED ORDER — MODAFINIL 100 MG PO TABS: 100.0000 mg | ORAL_TABLET | Freq: Every day | ORAL | 0 refills | Status: DC

## 2017-09-26 MED ORDER — RANITIDINE HCL 150 MG PO TABS: 150.0000 mg | ORAL_TABLET | Freq: Two times a day (BID) | ORAL | 3 refills | Status: DC

## 2017-09-26 NOTE — Addendum Note (Signed)
Addended by: Reuben LikesGANE, Dorrian Doggett on: 09/26/2017 01:20 PM   Modules accepted: Orders

## 2017-09-26 NOTE — Patient Instructions (Signed)
Add ranitidine twice daily to nightly nexium.

## 2017-09-26 NOTE — Assessment & Plan Note (Signed)
Lung exam reassuring.  Symptoms did resolve and then she got another cold earlier this month and now that cough is lingering.  GERD may also be playing a role.  eRx sent for ranitidine twice daily to take with nightly Nexium. Call or return to clinic prn if these symptoms worsen or fail to improve as anticipated. The patient indicates understanding of these issues and agrees with the plan.

## 2017-09-26 NOTE — Progress Notes (Signed)
Subjective:   Patient ID: Anna Burke, female    DOB: 1980-07-18, 37 y.o.   MRN: [id number]  Anna Prestolizabeth C Kaseman is a pleasant 37 y.o. year old female who presents to clinic today with Cough (cough has been lingering for weeks. has been having bad acid reflux, worse at night, trying OTC during day. Nexium at night is working good.) and Tdap vaccination (would like to get.)  on 09/26/2017  HPI:  Cough-  Treated for CAP on 05/07/17 with 7 day course of doxycyline twice daily.  Note reviewed.   Returned on 06/11/17 with similar symptoms.  Initially she felt better but symptoms never truly resolved but then again felt feverish, cough is more productive, right side of her chest is hurting.  On exam, she had persistent findings of PNA.  Given IM rocephin in the office that day, sent home with zpack and rx for tussionex as needed for severe cough. Symptoms completely resolved and then she got another URI earlier this month, not nearly as severe.  Today she is here because cough is lingering, worse at night.  Nexium at night helps with reflux.  She overall feels better.  Cough is lingering and having some acid reflux symptoms during the day which is new for her.  Current Outpatient Medications on File Prior to Visit  Medication Sig Dispense Refill  . buPROPion (WELLBUTRIN XL) 300 MG 24 hr tablet Take 1 tablet (300 mg total) by mouth daily. 90 tablet 1  . COLOSTRUM PO Take 2 tablets by mouth daily.    . diazepam (VALIUM) 5 MG tablet TAKE 1-2 TABLETS BY MOUTH DAILY AS NEEDED FOR MUSCLE SPASMS 30 tablet 2  . esomeprazole (NEXIUM) 40 MG capsule Take 1 capsule (40 mg total) by mouth daily. 90 capsule 1  . hydrOXYzine (ATARAX/VISTARIL) 25 MG tablet TAKE 1 TABLET BY MOUTH THREE TIMES A DAY 180 tablet 0  . methylphenidate (RITALIN) 20 MG tablet Take 1 tablet (20 mg total) by mouth 2 (two) times daily. 60 tablet 0  . norethindrone-ethinyl estradiol (JUNEL FE 1/20) 1-20 MG-MCG tablet Take 1  tablet by mouth daily. 84 tablet 1  . azithromycin (ZITHROMAX) 250 MG tablet 2 tabs by mouth on day 1 followed by 1 tab by mouth daily days 2- 5 (Patient not taking: Reported on 09/26/2017) 6 tablet 0  . chlorpheniramine-HYDROcodone (TUSSIONEX PENNKINETIC ER) 10-8 MG/5ML SUER Take 5 mLs by mouth every 12 (twelve) hours as needed. (Patient not taking: Reported on 09/26/2017) 140 mL 0   No current facility-administered medications on file prior to visit.     Allergies  Allergen Reactions  . Sulfa Antibiotics Hives  . Ortho Evra [Norelgestromin-Eth Estradiol] Rash and Other (See Comments)    Elevated BP  . Shellfish Allergy Nausea And Vomiting    Past Medical History:  Diagnosis Date  . Depression   . Insomnia    Sleep study on 09/03/2009 by Dr. Maple HudsonYoung consistent with insomnia but no organic sleep disturbance identified.  . Interstitial cystitis    [facility]    No past surgical history on file.  Family History  Problem Relation Age of Onset  . Arthritis Mother   . Heart disease Mother   . Arthritis Father     Social History   Socioeconomic History  . Marital status: Married    Spouse name: Not on file  . Number of children: 0  . Years of education: Not on file  . Highest education level: Not on file  Occupational History  . 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 Financial resource strain: Not on file  . Food insecurity:    Worry: Not on file    Inability: Not on file  . Transportation needs:    Medical: Not on file    Non-medical: Not on file  Tobacco Use  . Smoking status: Never Smoker  . Smokeless tobacco: Never Used  Substance and Sexual Activity  . Alcohol use: Yes  . Drug use: No  . Sexual activity: Not on file  Lifestyle  . Physical activity:    Days per week: Not on file    Minutes per session: Not on file  . Stress: Not on file  Relationships  . Social connections:    Talks on phone: Not on file    Gets together: Not  on file    Attends religious service: Not on file    Active member of club or organization: Not on file    Attends meetings of clubs or organizations: Not on file    Relationship status: Not on file  . Intimate partner violence:    Fear of current or ex partner: Not on file    Emotionally abused: Not on file    Physically abused: Not on file    Forced sexual activity: Not on file  Other Topics Concern  . Not on file  Social History Narrative  . Not on file   The PMH, PSH, Social History, Family History, Medications, and allergies have been reviewed in [facility], and have been updated if relevant.   Review of Systems  Constitutional: Positive for fatigue. Negative for fever.  HENT: Negative.   Eyes: Negative.   Respiratory: Positive for cough and shortness of breath. Negative for apnea, choking, chest tightness, wheezing and stridor.   Cardiovascular: Negative.   Gastrointestinal: Negative.   Endocrine: Negative.   Genitourinary: Negative.   Musculoskeletal: Negative.   Skin: Negative.   Allergic/Immunologic: Negative.   Neurological: Negative.   Hematological: Negative.   Psychiatric/Behavioral: Negative.   All other systems reviewed and are negative.      Objective:    BP (!) 140/96 (BP Location: Left Arm, Patient Position: Sitting, Cuff Size: Normal)   Pulse (!) 111   Temp 99.3 F (37.4 C) (Oral)   Ht 5' 3.5" (1.613 m)   Wt 218 lb (98.9 kg)   SpO2 98%   BMI 38.01 kg/m    Physical Exam  Constitutional: She is oriented to person, place, and time. She appears well-developed and well-nourished. No distress.  HENT:  Head: Normocephalic and atraumatic.  Eyes: EOM are normal.  Neck: Normal range of motion.  Cardiovascular: Normal rate and regular rhythm.  Pulmonary/Chest: Effort normal and breath sounds normal. She has no wheezes. She has no rales.  Musculoskeletal: Normal range of motion.  Neurological: She is alert and oriented to person, place, and time. No cranial  nerve deficit. Coordination normal.  Skin: Skin is warm and dry. She is not diaphoretic.  Psychiatric: She has a normal mood and affect. Her behavior is normal. Judgment and thought content normal.  Nursing note and vitals reviewed.         Assessment & Plan:   Persistent cough for 3 weeks or longer No follow-ups on file.

## 2017-10-09 ENCOUNTER — Telehealth: Payer: Self-pay

## 2017-10-09 NOTE — Telephone Encounter (Signed)
PA for Modafinil 100mg  #30 per 30d initiated and form faxed to plan/thx dmf

## 2017-10-14 NOTE — Telephone Encounter (Signed)
Per [facility] Modafinil 100mg  #30 per 30d is approved from 6.3.19-7.5.2020/LMOVM to pt/thx dmf

## 2017-10-29 ENCOUNTER — Other Ambulatory Visit: Payer: Self-pay | Admitting: Family Medicine

## 2017-10-29 ENCOUNTER — Other Ambulatory Visit: Payer: Self-pay

## 2017-10-31 ENCOUNTER — Encounter: Payer: Self-pay | Admitting: Family Medicine

## 2017-11-05 ENCOUNTER — Encounter: Payer: Self-pay | Admitting: Family Medicine

## 2017-11-14 ENCOUNTER — Other Ambulatory Visit: Payer: Self-pay | Admitting: Family Medicine

## 2017-11-26 ENCOUNTER — Other Ambulatory Visit: Payer: Self-pay | Admitting: Family Medicine

## 2017-11-27 ENCOUNTER — Telehealth: Payer: Self-pay | Admitting: Family Medicine

## 2017-11-27 NOTE — Telephone Encounter (Signed)
Spoke with the, pt request the sick verification form and FMLA from last year to fax over to below #. Pt will bring in the New updated FMLA form for Dr. Dayton MartesAron to sign later.   FYI    Copied from CRM [phone]#[id number]. Topic: Inquiry >> Nov 27, 2017  2:13 PM Windy KalataMichael, Taylor L, NT wrote: Reason for CRM: patient is calling and states her work is needing a copy of her sick verification form that was originally submitted last August 2018. Fax# [phone]

## 2017-12-11 ENCOUNTER — Other Ambulatory Visit: Payer: Self-pay | Admitting: Family Medicine

## 2017-12-12 MED ORDER — METHYLPHENIDATE HCL 20 MG PO TABS: 20.0000 mg | ORAL_TABLET | Freq: Two times a day (BID) | ORAL | 0 refills | Status: DC

## 2017-12-12 MED ORDER — MODAFINIL 100 MG PO TABS: 100.0000 mg | ORAL_TABLET | Freq: Every day | ORAL | 0 refills | Status: DC

## 2017-12-12 NOTE — Telephone Encounter (Signed)
Sent in today/duplicate request/thx dmf

## 2017-12-12 NOTE — Telephone Encounter (Signed)
TA-Prepared for September to be sent to pharmacy with note for pt to be seen in October/plz advise thx dmf

## 2018-01-29 ENCOUNTER — Other Ambulatory Visit: Payer: Self-pay | Admitting: Family Medicine

## 2018-02-19 ENCOUNTER — Ambulatory Visit: Payer: BLUE CROSS/BLUE SHIELD

## 2018-02-20 ENCOUNTER — Ambulatory Visit: Payer: BLUE CROSS/BLUE SHIELD

## 2018-03-19 ENCOUNTER — Other Ambulatory Visit: Payer: Self-pay | Admitting: Family Medicine

## 2018-04-01 ENCOUNTER — Other Ambulatory Visit: Payer: Self-pay

## 2018-04-01 MED ORDER — BUPROPION HCL ER (XL) 300 MG PO TB24: 300.0000 mg | ORAL_TABLET | Freq: Every day | ORAL | 2 refills | Status: DC

## 2018-04-01 MED ORDER — ESOMEPRAZOLE MAGNESIUM 40 MG PO CPDR: DELAYED_RELEASE_CAPSULE | ORAL | 2 refills | Status: DC

## 2018-04-08 ENCOUNTER — Telehealth: Payer: Self-pay | Admitting: Family Medicine

## 2018-04-08 NOTE — Telephone Encounter (Signed)
Copied from CRM [id number]. Topic: Quick Communication - See Telephone Encounter >> Apr 08, 2018 11:51 AM Windy KalataMichael, Quintana Canelo L, NT wrote: CRM for notification. See Telephone encounter for: 04/08/18.  Patient is calling and states she has switched to [facility] and she is trying to get modafinil (PROVIGIL) 100 MG tablet  refilled and it is telling her that doctor needs to approve home delivery. Please advise.  [facility] [place] [address] [place] [id number] Phone: [phone] Fax: [phone]

## 2018-04-10 MED ORDER — MODAFINIL 100 MG PO TABS: 100.0000 mg | ORAL_TABLET | Freq: Every day | ORAL | 0 refills | Status: DC

## 2018-04-10 NOTE — Telephone Encounter (Signed)
Can you look into this?  I think she missed her appointments with me correct?

## 2018-04-10 NOTE — Telephone Encounter (Signed)
Yes I will send it in now

## 2018-04-10 NOTE — Telephone Encounter (Signed)
Patient is requesting a refill of Provigil 100 mg to be sent to [facility].

## 2018-04-10 NOTE — Telephone Encounter (Signed)
TA-Last visit on 6.20.19/she scheduled flu shot twice and cancelled/do you want to send in an Rx for that and pt to sched appt? Plz advise/thx dmf

## 2018-04-11 NOTE — Telephone Encounter (Signed)
LMOVM stating for pt to call and sched appt before the Rx runs out that TA sent in/thx dmf

## 2018-05-07 ENCOUNTER — Other Ambulatory Visit: Payer: Self-pay | Admitting: Family Medicine

## 2018-06-04 ENCOUNTER — Other Ambulatory Visit: Payer: Self-pay | Admitting: Family Medicine

## 2018-06-04 NOTE — Telephone Encounter (Signed)
Copied from CRM [id number]. Topic: Quick Communication - Rx Refill/Question >> Jun 04, 2018  4:53 PM Jolayne Haines L wrote: Medication: modafinil (PROVIGIL) 100 MG tablet Has the patient contacted their pharmacy? Yes (Agent: If no, request that the patient contact the pharmacy for the refill.) (Agent: If yes, when and what did the pharmacy advise?)  Preferred Pharmacy (with phone number or street name): [id number] - [place], [place] - [address] AT [address] [place] [address] Phone: [phone] Fax: [phone]    Agent: Please be advised that RX refills may take up to 3 business days. We ask that you follow-up with your pharmacy.

## 2018-06-05 MED ORDER — MODAFINIL 100 MG PO TABS: 100.0000 mg | ORAL_TABLET | Freq: Every day | ORAL | 0 refills | Status: DC

## 2018-06-05 NOTE — Telephone Encounter (Signed)
TA-Pt is scheduled for 3.11.20/She was supposed to be seen in Oct and has cancelled twice/do you want to go ahead and fill for #30 until visit and see if she comes? Plz advise/thx dmf

## 2018-06-18 ENCOUNTER — Ambulatory Visit: Payer: BLUE CROSS/BLUE SHIELD | Admitting: Family Medicine

## 2018-06-26 ENCOUNTER — Other Ambulatory Visit: Payer: Self-pay

## 2018-06-26 NOTE — Telephone Encounter (Signed)
TA-Pt has not been seen since June/But in light of the current situation do you want to send in a 90d supply of the 3 selected medications to her mail order pharmacy? I received the request via fax/plz advise/thx dmf

## 2018-06-27 MED ORDER — NORETHIN ACE-ETH ESTRAD-FE 1-20 MG-MCG PO TABS: 1.0000 | ORAL_TABLET | Freq: Every day | ORAL | 0 refills | Status: DC

## 2018-06-27 MED ORDER — MODAFINIL 100 MG PO TABS: 100.0000 mg | ORAL_TABLET | Freq: Every day | ORAL | 0 refills | Status: DC

## 2018-06-27 MED ORDER — RANITIDINE HCL 150 MG PO TABS: 150.0000 mg | ORAL_TABLET | Freq: Two times a day (BID) | ORAL | 0 refills | Status: DC

## 2018-06-28 ENCOUNTER — Other Ambulatory Visit: Payer: Self-pay | Admitting: Family Medicine

## 2018-06-30 MED ORDER — METHYLPHENIDATE HCL 20 MG PO TABS: 20.0000 mg | ORAL_TABLET | Freq: Two times a day (BID) | ORAL | 0 refills | Status: DC

## 2018-06-30 NOTE — Telephone Encounter (Signed)
TA-Plz see req from 3.19.20

## 2018-06-30 NOTE — Telephone Encounter (Signed)
I have sent this to TA on 3.19.20/thx dmf

## 2018-06-30 NOTE — Addendum Note (Signed)
Addended by: Lerry Liner on: 06/30/2018 11:20 AM   Modules accepted: Orders

## 2018-07-02 NOTE — Telephone Encounter (Signed)
This was already approved/thx dmf

## 2018-08-05 ENCOUNTER — Ambulatory Visit (INDEPENDENT_AMBULATORY_CARE_PROVIDER_SITE_OTHER): Payer: BLUE CROSS/BLUE SHIELD | Admitting: Family Medicine

## 2018-08-05 VITALS — BP 126/84 | Ht 63.5 in | Wt 220.0 lb

## 2018-08-05 DIAGNOSIS — R030 Elevated blood-pressure reading, without diagnosis of hypertension: Secondary | ICD-10-CM

## 2018-08-05 DIAGNOSIS — R5382 Chronic fatigue, unspecified: Secondary | ICD-10-CM

## 2018-08-05 DIAGNOSIS — N3010 Interstitial cystitis (chronic) without hematuria: Secondary | ICD-10-CM

## 2018-08-05 NOTE — Progress Notes (Signed)
Virtual Visit via Video   Due to the COVID-19 pandemic, this visit was completed with telemedicine (audio/video) technology to reduce patient and provider exposure as well as to preserve personal protective equipment.   I connected with Anna Burke on 08/05/18 at 12:00 PM EDT by a video enabled telemedicine application and verified that I am speaking with the correct person using two identifiers. Location patient: Home Location provider:  [facility], Office Persons participating in the virtual visit: Anna Fritz, MD   I discussed the limitations of evaluation and management by telemedicine and the availability of in person appointments. The patient expressed understanding and agreed to proceed.  Care Team   Patient Care Team: Dianne Dun, MD as PCP - General (Family Medicine)  Subjective:   HPI:   She wanted to talk about getting a note for work stating she can take more breaks if necessary due to her IC.  Symptoms worse and under more stress with pandemic and working from home.  We discussed the symptoms she is having and I agree she should be allowed 10 minutes every hour if she feels she needs a break.  Letter written and sent to pt   Also she is concerned about her blood pressure. ? HTN- she bought an OTC wrist BP cuff and has had readings ranging from 101/62- 150/110s. Denies HA, blurred vision, CP or SOB.  Lab Results  Component Value Date   CREATININE 0.98 01/25/2016   She does have a family h/o HTN and CAD.     Review of Systems  Constitutional: Negative.   HENT: Negative.   Eyes: Negative.   Respiratory: Negative.   Cardiovascular: Negative.   Gastrointestinal: Negative.   Genitourinary: Positive for frequency and urgency. Negative for flank pain and hematuria.  Musculoskeletal: Negative.   Skin: Negative.   All other systems reviewed and are negative.    Patient Active Problem List   Diagnosis Date Noted  . Persistent  cough for 3 weeks or longer 09/26/2017  . GERD (gastroesophageal reflux disease) 01/25/2016  . Elevated blood-pressure reading without diagnosis of hypertension 01/25/2016  . Family history of premature CAD 12/23/2013  . Unspecified constipation 04/19/2013  . Chronic fatigue 01/06/2013  . Insomnia 01/24/2011  . Depression   . Interstitial cystitis     Social History   Tobacco Use  . Smoking status: Never Smoker  . Smokeless tobacco: Never Used  Substance Use Topics  . Alcohol use: Yes    Current Outpatient Medications:  .  buPROPion (WELLBUTRIN XL) 300 MG 24 hr tablet, TAKE 1 TABLET BY MOUTH EVERY DAY, Disp: 90 tablet, Rfl: 0 .  COLOSTRUM PO, Take 2 tablets by mouth daily., Disp: , Rfl:  .  diazepam (VALIUM) 5 MG tablet, TAKE 1-2 TABLETS BY MOUTH DAILY AS NEEDED FOR MUSCLE SPASMS, Disp: 30 tablet, Rfl: 2 .  esomeprazole (NEXIUM) 40 MG capsule, TAKE 1 CAPSULE BY MOUTH EVERY DAY, Disp: 90 capsule, Rfl: 2 .  hydrOXYzine (ATARAX/VISTARIL) 25 MG tablet, TAKE 1 TABLET BY MOUTH THREE TIMES A DAY, Disp: 270 tablet, Rfl: 1 .  methylphenidate (RITALIN) 20 MG tablet, Take 1 tablet (20 mg total) by mouth 2 (two) times daily., Disp: 180 tablet, Rfl: 0 .  modafinil (PROVIGIL) 100 MG tablet, Take 1 tablet (100 mg total) by mouth daily., Disp: 90 tablet, Rfl: 0 .  norethindrone-ethinyl estradiol (JUNEL FE 1/20) 1-20 MG-MCG tablet, Take 1 tablet by mouth daily., Disp: 84 tablet, Rfl: 0 .  ranitidine (  ZANTAC) 150 MG tablet, Take 1 tablet (150 mg total) by mouth 2 (two) times daily., Disp: 180 tablet, Rfl: 0  Allergies  Allergen Reactions  . Sulfa Antibiotics Hives  . Ortho Evra [Norelgestromin-Eth Estradiol] Rash and Other (See Comments)    Elevated BP  . Shellfish Allergy Nausea And Vomiting    Objective:  BP 126/84 Comment: pt report  Ht 5' 3.5" (1.613 m)   Wt 220 lb (99.8 kg) Comment: pt report  BMI 38.36 kg/m   VITALS: Per patient if applicable, see vitals. GENERAL: Alert, appears  well and in no acute distress. HEENT: Atraumatic, conjunctiva clear, no obvious abnormalities on inspection of external nose and ears. NECK: Normal movements of the head and neck. CARDIOPULMONARY: No increased WOB. Speaking in clear sentences. I:E ratio WNL.  MS: Moves all visible extremities without noticeable abnormality. PSYCH: Pleasant and cooperative, well-groomed. Speech normal rate and rhythm. Affect is appropriate. Insight and judgement are appropriate. Attention is focused, linear, and appropriate.  NEURO: CN grossly intact. Oriented as arrived to appointment on time with no prompting. Moves both UE equally.  SKIN: No obvious lesions, wounds, erythema, or cyanosis noted on face or hands.  Depression screen [facility] 2[facility] 08/05/2018 05/07/2017  Decreased Interest 1 0  Down, Depressed, Hopeless 1 2  PHQ - 2 Score 2 2  Altered sleeping 1 1  Tired, decreased energy 3 2  Change in appetite 1 0  Feeling bad or failure about yourself  1 0  Trouble concentrating 1 0  Moving slowly or fidgety/restless 0 0  Suicidal thoughts 0 0  PHQ-9 Score 9 5  Difficult doing work/chores - Somewhat difficult    Assessment and Plan:   Anna Manislizabeth was seen today for follow-up and follow-up.  Diagnoses and all orders for this visit:  Chronic fatigue  Interstitial cystitis  Elevated blood-pressure reading without diagnosis of hypertension    . COVID-19 Education:The signs and symptoms of COVID-19 were discussed with the patient and how to seek care for testing if needed. The importance of social distancing was discussed today. . Reviewed expectations re: course of current medical issues. . Discussed self-management of symptoms. . Outlined signs and symptoms indicating need for more acute intervention. . Patient verbalized understanding and all questions were answered. Marland Kitchen. Health Maintenance issues including appropriate healthy diet, exercise, and smoking avoidance were discussed with patient. . See  orders for this visit as documented in the electronic medical record.  Anna Mannanalia Madhav Mohon, MD 08/05/2018  Records requested if needed. Time spent: 25vminutes, of which >50% was spent in obtaining information about her symptoms, reviewing her previous labs, evaluations, and treatments, counseling her about her condition (please see the discussed topics above), and developing a plan to further investigate it; she had a number of questions which I addressed.

## 2018-08-05 NOTE — Assessment & Plan Note (Addendum)
Agree with pt that she should have accommodations.  Letter written as requested and sent to pt on my chart. >25 minutes spent in face to face time with patient, >50% spent in counselling or coordination of care

## 2018-08-05 NOTE — Assessment & Plan Note (Signed)
>  25 minutes spent in face to face time with patient, >50% spent in counselling or coordination of care discussing work accommodations and elevated blood pressure readings. Advised buying an OMRON brachial cuff and ask the pharmacist to show her how to use I properly.  To take readings at different times of day, keep a log and to keep me updated.

## 2018-08-23 ENCOUNTER — Other Ambulatory Visit: Payer: Self-pay | Admitting: Family Medicine

## 2018-08-29 ENCOUNTER — Other Ambulatory Visit: Payer: Self-pay | Admitting: Family Medicine

## 2018-09-19 ENCOUNTER — Other Ambulatory Visit: Payer: Self-pay

## 2018-09-19 MED ORDER — MODAFINIL 100 MG PO TABS: 100.0000 mg | ORAL_TABLET | Freq: Every day | ORAL | 1 refills | Status: DC

## 2018-09-19 NOTE — Telephone Encounter (Signed)
TA: LOV: 4.28.20/Pt req 90d to [place] of Modafinil/per [place] PMP pt is compliant without red flags/Rx prepared and pended/thx dmf

## 2018-12-04 ENCOUNTER — Other Ambulatory Visit: Payer: Self-pay

## 2018-12-04 DIAGNOSIS — Z20822 Contact with and (suspected) exposure to covid-19: Secondary | ICD-10-CM

## 2018-12-06 LAB — NOVEL CORONAVIRUS, NAA: SARS-CoV-2, NAA: NOT DETECTED

## 2019-01-06 ENCOUNTER — Telehealth: Payer: Self-pay

## 2019-01-06 ENCOUNTER — Other Ambulatory Visit: Payer: Self-pay

## 2019-01-06 ENCOUNTER — Ambulatory Visit (INDEPENDENT_AMBULATORY_CARE_PROVIDER_SITE_OTHER): Payer: BC Managed Care – PPO | Admitting: Family Medicine

## 2019-01-06 ENCOUNTER — Encounter: Payer: Self-pay | Admitting: Family Medicine

## 2019-01-06 VITALS — BP 148/94 | HR 110

## 2019-01-06 DIAGNOSIS — L659 Nonscarring hair loss, unspecified: Secondary | ICD-10-CM

## 2019-01-06 DIAGNOSIS — K219 Gastro-esophageal reflux disease without esophagitis: Secondary | ICD-10-CM

## 2019-01-06 DIAGNOSIS — R030 Elevated blood-pressure reading, without diagnosis of hypertension: Secondary | ICD-10-CM

## 2019-01-06 MED ORDER — FAMOTIDINE 40 MG PO TABS: 40.0000 mg | ORAL_TABLET | Freq: Every day | ORAL | 3 refills | Status: DC

## 2019-01-06 MED ORDER — OMEPRAZOLE 40 MG PO CPDR: 40.0000 mg | DELAYED_RELEASE_CAPSULE | Freq: Every day | ORAL | 3 refills | Status: DC

## 2019-01-06 NOTE — Assessment & Plan Note (Signed)
Referral placed to dermatology

## 2019-01-06 NOTE — Telephone Encounter (Signed)
PEC-I LM for pt to call and do one of two things  1.) Schedule an appointment with Dr. Deborra Medina for a F2F Mon-Wed of next week and bring her BP cuff.  2.) Schedule an appointment with an alternate provider F2F either Thurs-Fri of this week and bring her BP cuff.  Plz schedule/thx dmf

## 2019-01-06 NOTE — Assessment & Plan Note (Signed)
Will schedule pt to come in to office to be evaluated as this is something that needs to be assessed and addressed face to face. The patient indicates understanding of these issues and agrees with the plan.

## 2019-01-06 NOTE — Assessment & Plan Note (Signed)
Deteriorated.  Discussed GERD diet which is already basically follows due to her interstitial cystitis. We decided to d/c nexium and try omeprazole with H2 blocker (if available) as needed. The patient indicates understanding of these issues and agrees with the plan. Call or send my chart message prn if these symptoms worsen or fail to improve as anticipated.

## 2019-01-06 NOTE — Progress Notes (Signed)
TELEPHONE ENCOUNTER   Patient verbally agreed to telephone visit and is aware that copayment and coinsurance may apply. Patient was treated using telemedicine according to accepted telemedicine protocols.  Location of the patient: patient's home Location of provider: physician's office Names of all persons participating in the telemedicine service and role in the encounter: Arnette Norris, MD [place] [facility]  Subjective:   Chief Complaint  Patient presents with  . Heartburn    Pt agrees to virtual visit. She is C/O acid reflux being an ongoing issue since Zantac was taken off the market. She used to take Nexium and wants to know what she should do something in addition to this.  . Hypertension    Pt is also wanting to discuss elevated BP. She bought a new BP cuff and Systolic averages 419-622 and Diastolic averages between 90-110. Denies any swelling in extremeties. She is uncertain about bilateral intermittent arm/shoulder soreness/tingling but she is uncertain if this is due to HTN vs her being on computer 10 hours daily.  . Hair/Scalp Problem    She also is wondering if she needs a referral to derm due to her hair thinning.     HPI   GERD- worsening reflux since zantac was taken off the market. She has taken nexium but wonders what else she can take.  Elevated blood pressure-  She has a new BP cuff and systolic averages have been around 297-989 and diastolic averages between 90- 110.  Denies CP, SOB.  She is on her computer up to 10 hours per day.  Hair thinning- asking for dermatology referral.    Patient Active Problem List   Diagnosis Date Noted  . Persistent cough for 3 weeks or longer 09/26/2017  . GERD (gastroesophageal reflux disease) 01/25/2016  . Elevated blood-pressure reading without diagnosis of hypertension 01/25/2016  . Family history of premature CAD 12/23/2013  . Unspecified constipation 04/19/2013  . Chronic fatigue 01/06/2013  . Insomnia 01/24/2011  .  Depression   . Interstitial cystitis    Social History   Tobacco Use  . Smoking status: Never Smoker  . Smokeless tobacco: Never Used  Substance Use Topics  . Alcohol use: Yes    Current Outpatient Medications:  .  BLISOVI FE 1/20 1-20 MG-MCG tablet, TAKE 1 TABLET DAILY, Disp: 84 tablet, Rfl: 3 .  buPROPion (WELLBUTRIN XL) 300 MG 24 hr tablet, TAKE 1 TABLET BY MOUTH EVERY DAY, Disp: 90 tablet, Rfl: 0 .  diazepam (VALIUM) 5 MG tablet, TAKE 1-2 TABLETS BY MOUTH DAILY AS NEEDED FOR MUSCLE SPASMS, Disp: 30 tablet, Rfl: 2 .  esomeprazole (NEXIUM) 40 MG capsule, TAKE 1 CAPSULE BY MOUTH EVERY DAY, Disp: 90 capsule, Rfl: 2 .  hydrOXYzine (ATARAX/VISTARIL) 25 MG tablet, TAKE 1 TABLET BY MOUTH THREE TIMES A DAY, Disp: 270 tablet, Rfl: 1 .  methylphenidate (RITALIN) 20 MG tablet, Take 1 tablet (20 mg total) by mouth 2 (two) times daily., Disp: 180 tablet, Rfl: 0 .  modafinil (PROVIGIL) 100 MG tablet, Take 1 tablet (100 mg total) by mouth daily., Disp: 90 tablet, Rfl: 1 Allergies  Allergen Reactions  . Sulfa Antibiotics Hives  . Ortho Evra [Norelgestromin-Eth Estradiol] Rash and Other (See Comments)    Elevated BP  . Shellfish Allergy Nausea And Vomiting    Assessment & Plan:   No diagnosis found.  No orders of the defined types were placed in this encounter.  No orders of the defined types were placed in this encounter.   Arnette Norris, MD  01/06/2019  Time spent with the patient: 11 minutes, spent in obtaining information about her symptoms, reviewing her previous labs, evaluations, and treatments, counseling her about her condition (please see the discussed topics above), and developing a plan to further investigate it; she had a number of questions which I addressed.   [id number] physician/qualified health professional telephone evaluation 5 to 10 minutes [id number] physician/qualified help functional [place] evaluation for 11 to 20 minutes [id number] physician/qualify he will professional telephone  evaluation for 21 to 30 minutes

## 2019-01-07 ENCOUNTER — Ambulatory Visit: Payer: BC Managed Care – PPO | Admitting: Family Medicine

## 2019-01-08 ENCOUNTER — Ambulatory Visit: Payer: BC Managed Care – PPO | Admitting: Nurse Practitioner

## 2019-01-08 ENCOUNTER — Encounter: Payer: Self-pay | Admitting: Family Medicine

## 2019-01-08 ENCOUNTER — Ambulatory Visit: Payer: BC Managed Care – PPO | Admitting: Family Medicine

## 2019-01-08 ENCOUNTER — Other Ambulatory Visit: Payer: Self-pay

## 2019-01-08 VITALS — BP 142/98 | HR 102 | Temp 98.4°F | Ht 63.5 in | Wt 232.8 lb

## 2019-01-08 DIAGNOSIS — R030 Elevated blood-pressure reading, without diagnosis of hypertension: Secondary | ICD-10-CM | POA: Diagnosis not present

## 2019-01-08 DIAGNOSIS — L608 Other nail disorders: Secondary | ICD-10-CM | POA: Diagnosis not present

## 2019-01-08 DIAGNOSIS — Z8349 Family history of other endocrine, nutritional and metabolic diseases: Secondary | ICD-10-CM | POA: Diagnosis not present

## 2019-01-08 MED ORDER — LISINOPRIL 5 MG PO TABS: 5.0000 mg | ORAL_TABLET | Freq: Every day | ORAL | 0 refills | Status: DC

## 2019-01-08 MED ORDER — LISINOPRIL 5 MG PO TABS: 5.0000 mg | ORAL_TABLET | Freq: Every day | ORAL | 3 refills | Status: DC

## 2019-01-08 NOTE — Patient Instructions (Signed)
  Hypertension, Adult Hypertension is another name for high blood pressure. High blood pressure forces your heart to work harder to pump blood. This can cause problems over time. There are two numbers in a blood pressure reading. There is a top number (systolic) over a bottom number (diastolic). It is best to have a blood pressure that is below 120/80. Healthy choices can help lower your blood pressure, or you may need medicine to help lower it. What are the causes? The cause of this condition is not known. Some conditions may be related to high blood pressure. What increases the risk?  Smoking.  Having type 2 diabetes mellitus, high cholesterol, or both.  Not getting enough exercise or physical activity.  Being overweight.  Having too much fat, sugar, calories, or salt (sodium) in your diet.  Drinking too much alcohol.  Having long-term (chronic) kidney disease.  Having a family history of high blood pressure.  Age. Risk increases with age.  Race. You may be at higher risk if you are African American.  Gender. Men are at higher risk than women before age 45. After age 65, women are at higher risk than men.  Having obstructive sleep apnea.  Stress. What are the signs or symptoms?  High blood pressure may not cause symptoms. Very high blood pressure (hypertensive crisis) may cause: ? Headache. ? Feelings of worry or nervousness (anxiety). ? Shortness of breath. ? Nosebleed. ? A feeling of being sick to your stomach (nausea). ? Throwing up (vomiting). ? Changes in how you see. ? Very bad chest pain. ? Seizures. How is this treated?  This condition is treated by making healthy lifestyle changes, such as: ? Eating healthy foods. ? Exercising more. ? Drinking less alcohol.  Your health care provider may prescribe medicine if lifestyle changes are not enough to get your blood pressure under control, and if: ? Your top number is above 130. ? Your bottom number is  above 80.  Your personal target blood pressure may vary. Follow these instructions at home: Eating and drinking   If told, follow the DASH eating plan. To follow this plan: ? Fill one half of your plate at each meal with fruits and vegetables. ? Fill one fourth of your plate at each meal with whole grains. Whole grains include whole-wheat pasta, brown rice, and whole-grain bread. ? Eat or drink low-fat dairy products, such as skim milk or low-fat yogurt. ? Fill one fourth of your plate at each meal with low-fat (lean) proteins. Low-fat proteins include fish, chicken without skin, eggs, beans, and tofu. ? Avoid fatty meat, cured and processed meat, or chicken with skin. ? Avoid pre-made or processed food.  Eat less than 1,500 mg of salt each day.  Do not drink alcohol if: ? Your doctor tells you not to drink. ? You are pregnant, may be pregnant, or are planning to become pregnant.  If you drink alcohol: ? Limit how much you use to:  0-1 drink a day for women.  0-2 drinks a day for men. ? Be aware of how much alcohol is in your drink. In the [place] one drink equals one 12 oz bottle of beer (355 mL), one 5 oz glass of wine (148 mL), or one 1 oz glass of hard liquor (44 mL). Lifestyle   Work with your doctor to stay at a healthy weight or to lose weight. Ask your doctor what the best weight is for you.  Get at least 30 minutes of   exercise most days of the week. This may include walking, swimming, or biking.  Get at least 30 minutes of exercise that strengthens your muscles (resistance exercise) at least 3 days a week. This may include lifting weights or doing Pilates.  Do not use any products that contain nicotine or tobacco, such as cigarettes, e-cigarettes, and chewing tobacco. If you need help quitting, ask your doctor.  Check your blood pressure at home as told by your doctor.  Keep all follow-up visits as told by your doctor. This is important. Medicines  Take  over-the-counter and prescription medicines only as told by your doctor. Follow directions carefully.  Do not skip doses of blood pressure medicine. The medicine does not work as well if you skip doses. Skipping doses also puts you at risk for problems.  Ask your doctor about side effects or reactions to medicines that you should watch for. Contact a doctor if you:  Think you are having a reaction to the medicine you are taking.  Have headaches that keep coming back (recurring).  Feel dizzy.  Have swelling in your ankles.  Have trouble with your vision. Get help right away if you:  Get a very bad headache.  Start to feel mixed up (confused).  Feel weak or numb.  Feel faint.  Have very bad pain in your: ? Chest. ? Belly (abdomen).  Throw up more than once.  Have trouble breathing. Summary  Hypertension is another name for high blood pressure.  High blood pressure forces your heart to work harder to pump blood.  For most people, a normal blood pressure is less than 120/80.  Making healthy choices can help lower blood pressure. If your blood pressure does not get lower with healthy choices, you may need to take medicine. This information is not intended to replace advice given to you by your health care provider. Make sure you discuss any questions you have with your health care provider. Document Released: 09/12/2007 Document Revised: 12/04/2017 Document Reviewed: 12/04/2017 Elsevier Patient Education  2020 Elsevier Inc. DASH Eating Plan DASH stands for "Dietary Approaches to Stop Hypertension." The DASH eating plan is a healthy eating plan that has been shown to reduce high blood pressure (hypertension). It may also reduce your risk for type 2 diabetes, heart disease, and stroke. The DASH eating plan may also help with weight loss. What are tips for following this plan?  General guidelines  Avoid eating more than 2,300 mg (milligrams) of salt (sodium) a day. If you  have hypertension, you may need to reduce your sodium intake to 1,500 mg a day.  Limit alcohol intake to no more than 1 drink a day for nonpregnant women and 2 drinks a day for men. One drink equals 12 oz of beer, 5 oz of wine, or 1 oz of hard liquor.  Work with your health care provider to maintain a healthy body weight or to lose weight. Ask what an ideal weight is for you.  Get at least 30 minutes of exercise that causes your heart to beat faster (aerobic exercise) most days of the week. Activities may include walking, swimming, or biking.  Work with your health care provider or diet and nutrition specialist (dietitian) to adjust your eating plan to your individual calorie needs. Reading food labels   Check food labels for the amount of sodium per serving. Choose foods with less than 5 percent of the Daily Value of sodium. Generally, foods with less than 300 mg of sodium per serving   fit into this eating plan.  To find whole grains, look for the word "whole" as the first word in the ingredient list. Shopping  Buy products labeled as "low-sodium" or "no salt added."  Buy fresh foods. Avoid canned foods and premade or frozen meals. Cooking  Avoid adding salt when cooking. Use salt-free seasonings or herbs instead of table salt or sea salt. Check with your health care provider or pharmacist before using salt substitutes.  Do not fry foods. Cook foods using healthy methods such as baking, boiling, grilling, and broiling instead.  Cook with heart-healthy oils, such as olive, canola, soybean, or sunflower oil. Meal planning  Eat a balanced diet that includes: ? 5 or more servings of fruits and vegetables each day. At each meal, try to fill half of your plate with fruits and vegetables. ? Up to 6-8 servings of whole grains each day. ? Less than 6 oz of lean meat, poultry, or fish each day. A 3-oz serving of meat is about the same size as a deck of cards. One egg equals 1 oz. ? 2 servings  of low-fat dairy each day. ? A serving of nuts, seeds, or beans 5 times each week. ? Heart-healthy fats. Healthy fats called Omega-3 fatty acids are found in foods such as flaxseeds and coldwater fish, like sardines, salmon, and mackerel.  Limit how much you eat of the following: ? Canned or prepackaged foods. ? Food that is high in trans fat, such as fried foods. ? Food that is high in saturated fat, such as fatty meat. ? Sweets, desserts, sugary drinks, and other foods with added sugar. ? Full-fat dairy products.  Do not salt foods before eating.  Try to eat at least 2 vegetarian meals each week.  Eat more home-cooked food and less restaurant, buffet, and fast food.  When eating at a restaurant, ask that your food be prepared with less salt or no salt, if possible. What foods are recommended? The items listed may not be a complete list. Talk with your dietitian about what dietary choices are best for you. Grains Whole-grain or whole-wheat bread. Whole-grain or whole-wheat pasta. Brown rice. Oatmeal. Quinoa. Bulgur. Whole-grain and low-sodium cereals. Pita bread. Low-fat, low-sodium crackers. Whole-wheat flour tortillas. Vegetables Fresh or frozen vegetables (raw, steamed, roasted, or grilled). Low-sodium or reduced-sodium tomato and vegetable juice. Low-sodium or reduced-sodium tomato sauce and tomato paste. Low-sodium or reduced-sodium canned vegetables. Fruits All fresh, dried, or frozen fruit. Canned fruit in natural juice (without added sugar). Meat and other protein foods Skinless chicken or turkey. Ground chicken or turkey. Pork with fat trimmed off. Fish and seafood. Egg whites. Dried beans, peas, or lentils. Unsalted nuts, nut butters, and seeds. Unsalted canned beans. Lean cuts of beef with fat trimmed off. Low-sodium, lean deli meat. Dairy Low-fat (1%) or fat-free (skim) milk. Fat-free, low-fat, or reduced-fat cheeses. Nonfat, low-sodium ricotta or cottage cheese. Low-fat or  nonfat yogurt. Low-fat, low-sodium cheese. Fats and oils Soft margarine without trans fats. Vegetable oil. Low-fat, reduced-fat, or light mayonnaise and salad dressings (reduced-sodium). Canola, safflower, olive, soybean, and sunflower oils. Avocado. Seasoning and other foods Herbs. Spices. Seasoning mixes without salt. Unsalted popcorn and pretzels. Fat-free sweets. What foods are not recommended? The items listed may not be a complete list. Talk with your dietitian about what dietary choices are best for you. Grains Baked goods made with fat, such as croissants, muffins, or some breads. Dry pasta or rice meal packs. Vegetables Creamed or fried vegetables. Vegetables in a   cheese sauce. Regular canned vegetables (not low-sodium or reduced-sodium). Regular canned tomato sauce and paste (not low-sodium or reduced-sodium). Regular tomato and vegetable juice (not low-sodium or reduced-sodium). Pickles. Olives. Fruits Canned fruit in a light or heavy syrup. Fried fruit. Fruit in cream or butter sauce. Meat and other protein foods Fatty cuts of meat. Ribs. Fried meat. Bacon. Sausage. Bologna and other processed lunch meats. Salami. Fatback. Hotdogs. Bratwurst. Salted nuts and seeds. Canned beans with added salt. Canned or smoked fish. Whole eggs or egg yolks. Chicken or turkey with skin. Dairy Whole or 2% milk, cream, and half-and-half. Whole or full-fat cream cheese. Whole-fat or sweetened yogurt. Full-fat cheese. Nondairy creamers. Whipped toppings. Processed cheese and cheese spreads. Fats and oils Butter. Stick margarine. Lard. Shortening. Ghee. Bacon fat. Tropical oils, such as coconut, palm kernel, or palm oil. Seasoning and other foods Salted popcorn and pretzels. Onion salt, garlic salt, seasoned salt, table salt, and sea salt. Worcestershire sauce. Tartar sauce. Barbecue sauce. Teriyaki sauce. Soy sauce, including reduced-sodium. Steak sauce. Canned and packaged gravies. Fish sauce. Oyster  sauce. Cocktail sauce. Horseradish that you find on the shelf. Ketchup. Mustard. Meat flavorings and tenderizers. Bouillon cubes. Hot sauce and Tabasco sauce. Premade or packaged marinades. Premade or packaged taco seasonings. Relishes. Regular salad dressings. Where to find more information:  National Heart, Lung, and Blood Institute: www.nhlbi.nih.gov  American Heart Association: www.heart.org Summary  The DASH eating plan is a healthy eating plan that has been shown to reduce high blood pressure (hypertension). It may also reduce your risk for type 2 diabetes, heart disease, and stroke.  With the DASH eating plan, you should limit salt (sodium) intake to 2,300 mg a day. If you have hypertension, you may need to reduce your sodium intake to 1,500 mg a day.  When on the DASH eating plan, aim to eat more fresh fruits and vegetables, whole grains, lean proteins, low-fat dairy, and heart-healthy fats.  Work with your health care provider or diet and nutrition specialist (dietitian) to adjust your eating plan to your individual calorie needs. This information is not intended to replace advice given to you by your health care provider. Make sure you discuss any questions you have with your health care provider. Document Released: 03/15/2011 Document Revised: 03/08/2017 Document Reviewed: 03/19/2016 Elsevier Patient Education  2020 Elsevier Inc.  

## 2019-01-08 NOTE — Progress Notes (Signed)
Anna Burke is a 38 y.o. female  Chief Complaint  Patient presents with  . Follow-up    Pt has been concerned that her BP has been elevated at home, running in the 130-140s/100s Denies headaches    HPI: Anna Burke is a 38 y.o. female patient of Dr. Deborra Medina who is here to f/u on her BP after telephone visit with PCP 2 days ago on 01/06/19. Pt states BP have been elevated x 1 year. She got a new home BP cuff and SBP readings 130-140's, DBP readings mid 90's-100's.  No fam h/o HTN.  Pt has not had BMP in the past year.   Diet: denies high sodium diet Exercise: nothing consistent - was walking more in the spring but not recently Stress: works (from home) for [employer], recent divorce and move  Pt has gained weight - 12lbs in past 5 mo.   Pt denies HA, CP, SOB, palpitations, vision changes, dizziness.  Pt also mentions her nails are thinner and growing in a curved shape. She also notes hair thinning and will be seeing derm. Will check TSH, CBC, iron, B12, Vit D.  Past Medical History:  Diagnosis Date  . Depression   . Insomnia    Sleep study on 09/03/2009 by Dr. Annamaria Boots consistent with insomnia but no organic sleep disturbance identified.  . Interstitial cystitis    Dr. Amalia Hailey [facility]    History reviewed. No pertinent surgical history.  Social History   Socioeconomic History  . Marital status: Married    Spouse name: Not on file  . Number of children: 0  . Years of education: Not on file  . Highest education level: Not on file  Occupational History  . 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 Financial resource strain: Not on file  . Food insecurity    Worry: Not on file    Inability: Not on file  . Transportation needs    Medical: Not on file    Non-medical: Not on file  Tobacco Use  . Smoking status: Never Smoker  . Smokeless tobacco: Never Used  Substance and Sexual Activity  . Alcohol use: Yes  . Drug use: No  .  Sexual activity: Not on file  Lifestyle  . Physical activity    Days per week: Not on file    Minutes per session: Not on file  . Stress: Not on file  Relationships  . Social Herbalist on phone: Not on file    Gets together: Not on file    Attends religious service: Not on file    Active member of club or organization: Not on file    Attends meetings of clubs or organizations: Not on file    Relationship status: Not on file  . Intimate partner violence    Fear of current or ex partner: Not on file    Emotionally abused: Not on file    Physically abused: Not on file    Forced sexual activity: Not on file  Other Topics Concern  . Not on file  Social History Narrative  . Not on file    Family History  Problem Relation Age of Onset  . Arthritis Mother   . Heart disease Mother   . Arthritis Father      Immunization History  Administered Date(s) Administered  . Tdap 09/26/2017    Outpatient Encounter Medications as of 01/08/2019  Medication Sig  . BLISOVI  FE 1/20 1-20 MG-MCG tablet TAKE 1 TABLET DAILY  . buPROPion (WELLBUTRIN XL) 300 MG 24 hr tablet TAKE 1 TABLET BY MOUTH EVERY DAY  . diazepam (VALIUM) 5 MG tablet TAKE 1-2 TABLETS BY MOUTH DAILY AS NEEDED FOR MUSCLE SPASMS  . esomeprazole (NEXIUM) 40 MG capsule TAKE 1 CAPSULE BY MOUTH EVERY DAY  . famotidine (PEPCID) 40 MG tablet Take 1 tablet (40 mg total) by mouth daily.  . hydrOXYzine (ATARAX/VISTARIL) 25 MG tablet TAKE 1 TABLET BY MOUTH THREE TIMES A DAY  . methylphenidate (RITALIN) 20 MG tablet Take 1 tablet (20 mg total) by mouth 2 (two) times daily.  . modafinil (PROVIGIL) 100 MG tablet Take 1 tablet (100 mg total) by mouth daily.  Marland Kitchen. lisinopril (ZESTRIL) 5 MG tablet Take 1 tablet (5 mg total) by mouth daily.  . [DISCONTINUED] lisinopril (ZESTRIL) 5 MG tablet Take 1 tablet (5 mg total) by mouth daily.  . [DISCONTINUED] omeprazole (PRILOSEC) 40 MG capsule Take 1 capsule (40 mg total) by mouth daily. (Patient  not taking: Reported on 01/08/2019)   No facility-administered encounter medications on file as of 01/08/2019.      ROS: Pertinent positives and negatives noted in HPI. Remainder of ROS non-contributory    Allergies  Allergen Reactions  . Sulfa Antibiotics Hives  . Ortho Evra [Norelgestromin-Eth Estradiol] Rash and Other (See Comments)    Elevated BP  . Shellfish Allergy Nausea And Vomiting    BP (!) 142/98 (BP Location: Right Arm)   Pulse (!) 102   Temp 98.4 F (36.9 C)   Ht 5' 3.5" (1.613 m)   Wt 232 lb 12.8 oz (105.6 kg)   SpO2 98%   BMI 40.59 kg/m   BP Readings from Last 3 Encounters:  01/08/19 (!) 142/98  01/06/19 (!) 148/94  08/05/18 126/84   Wt Readings from Last 3 Encounters:  01/08/19 232 lb 12.8 oz (105.6 kg)  08/05/18 220 lb (99.8 kg)  09/26/17 218 lb (98.9 kg)    Physical Exam  Constitutional: She is oriented to person, place, and time. She appears well-developed and well-nourished. No distress.  Neck: No thyromegaly present.  Cardiovascular: Normal rate, regular rhythm, normal heart sounds and intact distal pulses.  No murmur heard. Pulmonary/Chest: Effort normal and breath sounds normal. No respiratory distress.  Musculoskeletal:        General: No edema.  Neurological: She is alert and oriented to person, place, and time.  Psychiatric: She has a normal mood and affect. Her behavior is normal.     A/P:  1. Elevated blood-pressure reading without diagnosis of hypertension - home and in-office readings elevated - systolic 135-150, diastolic 90-110 - pt is agreeable to starting anti-HTN med Rx: - lisinopril (ZESTRIL) 5 MG tablet; Take 1 tablet (5 mg total) by mouth daily.  Dispense: 30 tablet; Refill: 0 - reviewed DASH diet and included info in AVS - stressed importance of regular CV exercise as well as weight loss in order to improve BP - check BP daily x 1-2 wks and send MyChart message or call PCP with readings - if BP still elevated at that  time, would plan to increase lisinopril to 10mg  daily Discussed plan and reviewed medications with patient, including risks, benefits, and potential side effects. Pt expressed understand. All questions answered.  2. Nail deformity - Basic metabolic panel - CBC - TSH - Iron, TIBC and Ferritin Panel - Vitamin B12 - VITAMIN D 25 Hydroxy (Vit-D Deficiency, Fractures) - has derm referral, previously placed by  PCP  3. Family history of hypothyroidism - TSH

## 2019-01-09 ENCOUNTER — Encounter: Payer: Self-pay | Admitting: Family Medicine

## 2019-01-09 LAB — BASIC METABOLIC PANEL
BUN: 14 mg/dL (ref 6–23)
CO2: 20 mEq/L (ref 19–32)
Calcium: 9.2 mg/dL (ref 8.4–10.5)
Chloride: 103 mEq/L (ref 96–112)
Creatinine, Ser: 0.85 mg/dL (ref 0.40–1.20)
GFR: 74.81 mL/min (ref >60.00)
Glucose, Bld: 94 mg/dL (ref 70–99)
Potassium: 3.9 mEq/L (ref 3.5–5.1)
Sodium: 137 mEq/L (ref 135–145)

## 2019-01-09 LAB — CBC
HCT: 40 % (ref 36.0–46.0)
Hemoglobin: 13.3 g/dL (ref 12.0–15.0)
MCHC: 33.3 g/dL (ref 30.0–36.0)
MCV: 90.9 fl (ref 78.0–100.0)
Platelets: 390 10*3/uL (ref 150.0–400.0)
RBC: 4.4 Mil/uL (ref 3.87–5.11)
RDW: 13.6 % (ref 11.5–15.5)
WBC: 8.5 10*3/uL (ref 4.0–10.5)

## 2019-01-09 LAB — VITAMIN D 25 HYDROXY (VIT D DEFICIENCY, FRACTURES): VITD: 25.18 ng/mL — ABNORMAL LOW (ref 30.00–100.00)

## 2019-01-09 LAB — IRON,TIBC AND FERRITIN PANEL
%SAT: 14 % (calc) — ABNORMAL LOW (ref 16–45)
Ferritin: 30 ng/mL (ref 16–154)
Iron: 61 ug/dL (ref 40–190)
TIBC: 445 mcg/dL (calc) (ref 250–450)

## 2019-01-09 LAB — VITAMIN B12: Vitamin B-12: 334 pg/mL (ref 211–911)

## 2019-01-09 LAB — TSH: TSH: 2.45 u[IU]/mL (ref 0.35–4.50)

## 2019-01-30 ENCOUNTER — Other Ambulatory Visit: Payer: Self-pay | Admitting: Family Medicine

## 2019-01-30 DIAGNOSIS — R030 Elevated blood-pressure reading, without diagnosis of hypertension: Secondary | ICD-10-CM

## 2019-02-03 ENCOUNTER — Other Ambulatory Visit: Payer: Self-pay

## 2019-02-03 DIAGNOSIS — Z20822 Contact with and (suspected) exposure to covid-19: Secondary | ICD-10-CM

## 2019-02-05 LAB — NOVEL CORONAVIRUS, NAA: SARS-CoV-2, NAA: NOT DETECTED

## 2019-02-10 ENCOUNTER — Ambulatory Visit (INDEPENDENT_AMBULATORY_CARE_PROVIDER_SITE_OTHER): Payer: BC Managed Care – PPO | Admitting: Family Medicine

## 2019-02-10 ENCOUNTER — Encounter: Payer: Self-pay | Admitting: Family Medicine

## 2019-02-10 DIAGNOSIS — Z91048 Other nonmedicinal substance allergy status: Secondary | ICD-10-CM

## 2019-02-10 NOTE — Assessment & Plan Note (Signed)
Letter written for pt and I told her I would be happy to fill out paper work to protect her from losing her job.  She will send it to [place] through mychart.

## 2019-02-10 NOTE — Progress Notes (Signed)
TELEPHONE ENCOUNTER   Patient verbally agreed to telephone visit and is aware that copayment and coinsurance may apply. Patient was treated using telemedicine according to accepted telemedicine protocols.  Location of the patient:  Patient's home Location of provider:  Physician's home Names of all persons participating in the telemedicine service and role in the encounter: Ruthe Mannan, MD [name]  Subjective:   Chief Complaint  Patient presents with  . Sinus Problem    Pt agrees to virtual visit. She is C/O a possible sinus infection. She is feeling better than she did. Work requires documentation in order to not get points. She completed a COVID test that was negative.  She has as a residual cough left.     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  Needs a letter excusing her from work on the days that she was there and some [employer] paperwork which she will send to [place].  The sinus pressure is already much better and she does not think she has a sinus infection.  She has a known mold allergy.  Breathing fine- had a covid test done to be on the safe side which was negative. Patient Active Problem List   Diagnosis Date Noted  . 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 Hair thinning 01/06/2019  . GERD (gastroesophageal reflux disease) 01/25/2016  . Elevated blood-pressure reading without diagnosis of hypertension 01/25/2016  . Family history of premature CAD 12/23/2013  . Unspecified constipation 04/19/2013  . Chronic fatigue 01/06/2013  . Insomnia 01/24/2011  . Depression   . Interstitial cystitis    Social History   Tobacco Use  . Smoking status: Never Smoker  . Smokeless tobacco: Never Used  Substance Use Topics  . Alcohol use: Yes    Current Outpatient Medications:  .  BLISOVI FE 1/20 1-20 MG-MCG tablet, TAKE 1 TABLET DAILY, Disp: 84 tablet, Rfl: 3 .  buPROPion (WELLBUTRIN XL) 300 MG  24 hr tablet, TAKE 1 TABLET BY MOUTH EVERY DAY, Disp: 90 tablet, Rfl: 0 .  diazepam (VALIUM) 5 MG tablet, TAKE 1-2 TABLETS BY MOUTH DAILY AS NEEDED FOR MUSCLE SPASMS, Disp: 30 tablet, Rfl: 2 .  esomeprazole (NEXIUM) 40 MG capsule, TAKE 1 CAPSULE BY MOUTH EVERY DAY, Disp: 90 capsule, Rfl: 2 .  famotidine (PEPCID) 40 MG tablet, Take 1 tablet (40 mg total) by mouth daily., Disp: 90 tablet, Rfl: 3 .  hydrOXYzine (ATARAX/VISTARIL) 25 MG tablet, TAKE 1 TABLET BY MOUTH THREE TIMES A DAY, Disp: 270 tablet, Rfl: 1 .  lisinopril (ZESTRIL) 5 MG tablet, TAKE 1 TABLET BY MOUTH EVERY DAY, Disp: 30 tablet, Rfl: 0 .  methylphenidate (RITALIN) 20 MG tablet, Take 1 tablet (20 mg total) by mouth 2 (two) times daily., Disp: 180 tablet, Rfl: 0 .  modafinil (PROVIGIL) 100 MG tablet, Take 1 tablet (100 mg total) by mouth daily., Disp: 90 tablet, Rfl: 1 Allergies  Allergen Reactions  . Sulfa Antibiotics Hives  . Ortho Evra [Norelgestromin-Eth Estradiol] Rash and Other (See Comments)    Elevated BP  . Shellfish Allergy Nausea And Vomiting    Assessment & Plan:   1. Allergy to mold     No orders of the defined types were placed in this encounter.  No orders of the defined types were placed in this encounter.   Ruthe Mannan, MD 02/10/2019  Time spent with the patient: 11 minutes,  spent in obtaining information about her symptoms, reviewing her previous labs, evaluations, and treatments, counseling her about her condition (please see the discussed topics above), and developing a plan to further investigate it; she had a number of questions which I addressed.   [id number] physician/qualified health professional telephone evaluation 5 to 10 minutes [id number] physician/qualified help functional [place] evaluation for 11 to 20 minutes [id number] physician/qualify he will professional telephone evaluation for 21 to 30 minutes

## 2019-02-13 ENCOUNTER — Other Ambulatory Visit: Payer: Self-pay | Admitting: Family Medicine

## 2019-02-16 NOTE — Telephone Encounter (Signed)
Last fill 05/08/18  #90/0 Last OV/VV 02/10/19

## 2019-02-23 ENCOUNTER — Telehealth: Payer: Self-pay

## 2019-02-23 NOTE — Telephone Encounter (Signed)
PA for Modafanil to [facility] initiated and faxed/thx dmf

## 2019-02-24 NOTE — Telephone Encounter (Signed)
The requested PA for Modafinil 100mg  tablet has been approved on 02/23/19. [facility].  Form scanned in pt's chart.

## 2019-03-12 ENCOUNTER — Other Ambulatory Visit: Payer: Self-pay

## 2019-03-12 DIAGNOSIS — Z20822 Contact with and (suspected) exposure to covid-19: Secondary | ICD-10-CM

## 2019-03-15 LAB — NOVEL CORONAVIRUS, NAA: SARS-CoV-2, NAA: NOT DETECTED

## 2019-03-25 NOTE — Progress Notes (Signed)
Virtual Visit via Video   Due to the COVID-19 pandemic, this visit was completed with telemedicine (audio/video) technology to reduce patient and provider exposure as well as to preserve personal protective equipment.   I connected with Debbora PrestoElizabeth C Skibinski by a video enabled telemedicine application and verified that I am speaking with the correct person using two identifiers. Location patient: Home Location provider: [facility], Office Persons participating in the virtual visit: Mliss FritzElizabeth C Mix, Jazmynn Pho, MD   I discussed the limitations of evaluation and management by telemedicine and the availability of in person appointments. The patient expressed understanding and agreed to proceed.  Care Team   Patient Care Team: Dianne DunAron, Catalaya Garr M, MD as PCP - General (Family Medicine)  Subjective:   HPI: Patient is reaching out virtually to discuss her cough which she feels has turned into bronchitis, x 1 month.  She has a long history of Community acquired Pneumonia.  Cough is intermittently productive.   Afebrile.  Checked [place]MP which pt is compliant with out red flags  Tested negative for covid 2 weeks ago ( 03/12/19).  She was diagnosed with CAP twice in 2019.  Also on an ACEI and has h/o GERD along with chronic asthmatic bronchitis which complicates the situation.  Been around inhaled irritants- mold.  She is taking omeprazole 40 mg every evening.  Pepcid did not help.  Nexium worked best.  Review of Systems  Constitutional: Negative for chills, fever, malaise/fatigue and weight loss.  HENT: Positive for congestion and sinus pain. Negative for ear discharge and hearing loss.   Eyes: Negative for discharge and redness.  Respiratory: Positive for cough, sputum production, shortness of breath and wheezing. Negative for hemoptysis.   Cardiovascular: Negative for chest pain.  Gastrointestinal: Negative for abdominal pain and heartburn.  Genitourinary: Negative for dysuria.    Musculoskeletal: Negative for falls and myalgias.  Skin: Negative for rash.  Neurological: Negative for sensory change and loss of consciousness.  Endo/Heme/Allergies: Does not bruise/bleed easily.  Psychiatric/Behavioral: Negative for depression and memory loss.  All other systems reviewed and are negative.    Patient Active Problem List   Diagnosis Date Noted  . Chronic cough 03/26/2019  . 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 Hair thinning 01/06/2019  . GERD (gastroesophageal reflux disease) 01/25/2016  . Elevated blood-pressure reading without diagnosis of hypertension 01/25/2016  . Family history of premature CAD 12/23/2013  . Unspecified constipation 04/19/2013  . Chronic fatigue 01/06/2013  . Insomnia 01/24/2011  . Depression   . Interstitial cystitis     Social History   Tobacco Use  . Smoking status: Never Smoker  . Smokeless tobacco: Never Used  Substance Use Topics  . Alcohol use: Yes    Current Outpatient Medications:  .  BLISOVI FE 1/20 1-20 MG-MCG tablet, TAKE 1 TABLET DAILY, Disp: 84 tablet, Rfl: 3 .  buPROPion (WELLBUTRIN XL) 300 MG 24 hr tablet, TAKE 1 TABLET DAILY, Disp: 90 tablet, Rfl: 3 .  diazepam (VALIUM) 5 MG tablet, TAKE 1-2 TABLETS BY MOUTH DAILY AS NEEDED FOR MUSCLE SPASMS, Disp: 30 tablet, Rfl: 2 .  esomeprazole (NEXIUM) 40 MG capsule, TAKE 1 CAPSULE BY MOUTH EVERY DAY, Disp: 90 capsule, Rfl: 2 .  famotidine (PEPCID) 40 MG tablet, Take 1 tablet (40 mg total) by mouth daily., Disp: 90 tablet, Rfl: 3 .  hydrOXYzine (ATARAX/VISTARIL) 25 MG tablet, TAKE 1 TABLET BY MOUTH THREE TIMES A DAY, Disp: 270 tablet, Rfl: 1 .  lisinopril (ZESTRIL) 5 MG tablet, TAKE  1 TABLET BY MOUTH EVERY DAY, Disp: 30 tablet, Rfl: 0 .  methylphenidate (RITALIN) 20 MG tablet, Take 1 tablet (20 mg total) by mouth 2 (two) times daily., Disp: 180 tablet, Rfl: 0 .  modafinil (PROVIGIL) 100 MG tablet, Take 1 tablet (100 mg total) by mouth daily., Disp: 90 tablet, Rfl: 1  Allergies   Allergen Reactions  . Sulfa Antibiotics Hives  . Ortho Evra [Norelgestromin-Eth Estradiol] Rash and Other (See Comments)    Elevated BP  . Shellfish Allergy Nausea And Vomiting    Objective:  Ht 5' 3.5" (1.613 m)   Wt 232 lb (105.2 kg)   BMI 40.45 kg/m   VITALS: Per patient if applicable, see vitals. GENERAL: Alert, appears well and in no acute distress. HEENT: Atraumatic, conjunctiva clear, no obvious abnormalities on inspection of external nose and ears. NECK: Normal movements of the head and neck. CARDIOPULMONARY: No increased WOB. Speaking in clear sentences. I:E ratio WNL.  MS: Moves all visible extremities without noticeable abnormality. PSYCH: Pleasant and cooperative, well-groomed. Speech normal rate and rhythm. Affect is appropriate. Insight and judgement are appropriate. Attention is focused, linear, and appropriate.  NEURO: CN grossly intact. Oriented as arrived to appointment on time with no prompting. Moves both UE equally.  SKIN: No obvious lesions, wounds, erythema, or cyanosis noted on face or hands.  Depression screen [facility] 2/9 01/06/2019 08/05/2018 05/07/2017  Decreased Interest 0 1 0  Down, Depressed, Hopeless 0 1 2  PHQ - 2 Score 0 2 2  Altered sleeping - 1 1  Tired, decreased energy - 3 2  Change in appetite - 1 0  Feeling bad or failure about yourself  - 1 0  Trouble concentrating - 1 0  Moving slowly or fidgety/restless - 0 0  Suicidal thoughts - 0 0  PHQ-9 Score - 9 5  Difficult doing work/chores - - Somewhat difficult     . COVID-19 Education: The signs and symptoms of COVID-19 were discussed with the patient and how to seek care for testing if needed. The importance of social distancing was discussed today. . Reviewed expectations re: course of current medical issues. . Discussed self-management of symptoms. . Outlined signs and symptoms indicating need for more acute intervention. . Patient verbalized understanding and all questions were  answered. Marland Kitchen Health Maintenance issues including appropriate healthy diet, exercise, and smoking avoidance were discussed with patient. . See orders for this visit as documented in the electronic medical record.  Arnette Norris, MD  Records requested if needed. Time spent: 40 minutes, of which >50% was spent in obtaining information about her symptoms, reviewing her previous labs, evaluations, and treatments, counseling her about her condition (please see the discussed topics above), and developing a plan to further investigate it; she had a number of questions which I addressed.   Lab Results  Component Value Date   WBC 8.5 01/08/2019   HGB 13.3 01/08/2019   HCT 40.0 01/08/2019   PLT 390.0 01/08/2019   GLUCOSE 94 01/08/2019   CHOL 176 03/25/2015   TRIG 171.0 (H) 03/25/2015   HDL 56.70 03/25/2015   LDLDIRECT 145.4 12/23/2013   LDLCALC 85 03/25/2015   ALT 15 01/25/2016   AST 16 01/25/2016   NA 137 01/08/2019   K 3.9 01/08/2019   CL 103 01/08/2019   CREATININE 0.85 01/08/2019   BUN 14 01/08/2019   CO2 20 01/08/2019   TSH 2.45 01/08/2019    Lab Results  Component Value Date   TSH 2.45 01/08/2019  Lab Results  Component Value Date   WBC 8.5 01/08/2019   HGB 13.3 01/08/2019   HCT 40.0 01/08/2019   MCV 90.9 01/08/2019   PLT 390.0 01/08/2019   Lab Results  Component Value Date   NA 137 01/08/2019   K 3.9 01/08/2019   CO2 20 01/08/2019   GLUCOSE 94 01/08/2019   BUN 14 01/08/2019   CREATININE 0.85 01/08/2019   BILITOT 0.5 01/25/2016   ALKPHOS 88 01/25/2016   AST 16 01/25/2016   ALT 15 01/25/2016   PROT 7.7 01/25/2016   ALBUMIN 4.1 01/25/2016   CALCIUM 9.2 01/08/2019   GFR 74.81 01/08/2019   Lab Results  Component Value Date   CHOL 176 03/25/2015   Lab Results  Component Value Date   HDL 56.70 03/25/2015   Lab Results  Component Value Date   LDLCALC 85 03/25/2015   Lab Results  Component Value Date   TRIG 171.0 (H) 03/25/2015   Lab Results  Component  Value Date   CHOLHDL 3 03/25/2015   No results found for: HGBA1C     Assessment & Plan:   Problem List Items Addressed This Visit      Active Problems   Chronic cough      I am having [name] maintain her diazepam, hydrOXYzine, esomeprazole, methylphenidate, Blisovi FE 1/20, modafinil, famotidine, lisinopril, and buPROPion.  No orders of the defined types were placed in this encounter.    Ruthe Mannan, MD

## 2019-03-26 ENCOUNTER — Telehealth (INDEPENDENT_AMBULATORY_CARE_PROVIDER_SITE_OTHER): Payer: BC Managed Care – PPO | Admitting: Family Medicine

## 2019-03-26 ENCOUNTER — Encounter: Payer: Self-pay | Admitting: Family Medicine

## 2019-03-26 DIAGNOSIS — R051 Acute cough: Secondary | ICD-10-CM | POA: Insufficient documentation

## 2019-03-26 DIAGNOSIS — R05 Cough: Secondary | ICD-10-CM

## 2019-03-26 DIAGNOSIS — R053 Chronic cough: Secondary | ICD-10-CM | POA: Insufficient documentation

## 2019-03-26 MED ORDER — DOXYCYCLINE HYCLATE 100 MG PO TABS: 100.0000 mg | ORAL_TABLET | Freq: Two times a day (BID) | ORAL | 0 refills | Status: DC

## 2019-03-26 MED ORDER — PREDNISONE 10 MG PO TABS: ORAL_TABLET | ORAL | 0 refills | Status: DC

## 2019-03-26 MED ORDER — ESOMEPRAZOLE MAGNESIUM 40 MG PO CPDR: 40.0000 mg | DELAYED_RELEASE_CAPSULE | Freq: Every day | ORAL | 3 refills | Status: DC

## 2019-03-26 MED ORDER — HYDROCOD POLST-CPM POLST ER 10-8 MG/5ML PO SUER: 5.0000 mL | Freq: Two times a day (BID) | ORAL | 0 refills | Status: DC | PRN

## 2019-03-26 MED ORDER — MONTELUKAST SODIUM 10 MG PO TABS: 10.0000 mg | ORAL_TABLET | Freq: Every day | ORAL | 3 refills | Status: DC

## 2019-03-26 NOTE — Assessment & Plan Note (Signed)
The most common causes of chronic cough include the following: upper airway cough syndrome (UACS) which is caused by variety of rhinosinus conditions; asthma; gastroesophageal reflux disease (GERD); chronic bronchitis from cigarette smoking or other inhaled environmental irritants; non-asthmatic eosinophilic bronchitis; and bronchiectasis. In prospective studies, these conditions have accounted for up to 94% of the causes of chronic cough in immunocompetent adults. The history and physical examination suggest that her cough is multifactorial with contribution from allergic asthmatic bronchitis, inhaled irritants and gerd. We will address these issues at this time.    Prednisone burst has been sent to her pharmacy on file.  Also a prescription has been provided for Tussionex ER suspension, 5 mL every 12 hours over the next 3 days, then stop.  Since her GERD has not been under good control, we placed her back on Nexium which was more efficacious for her.  And given her history CAP, change in sputum and duration of symptoms, eRx also sent in for doxycyline.  Singulair was sent to her pharmacy to help with the respiratory allergic manifestations  The patient indicates understanding of these issues and agrees with the plan. Call or send my chart message prn if these symptoms worsen or fail to improve as anticipated. The patient indicates understanding of these issues and agrees with the plan.

## 2019-04-14 ENCOUNTER — Encounter: Payer: Self-pay | Admitting: Family Medicine

## 2019-04-14 ENCOUNTER — Ambulatory Visit (INDEPENDENT_AMBULATORY_CARE_PROVIDER_SITE_OTHER): Payer: BC Managed Care – PPO | Admitting: Family Medicine

## 2019-04-14 VITALS — Temp 97.2°F

## 2019-04-14 DIAGNOSIS — J45991 Cough variant asthma: Secondary | ICD-10-CM | POA: Insufficient documentation

## 2019-04-14 MED ORDER — FLUCONAZOLE 150 MG PO TABS: 150.0000 mg | ORAL_TABLET | Freq: Once | ORAL | 0 refills | Status: AC

## 2019-04-14 MED ORDER — PREDNISONE 10 MG PO TABS: ORAL_TABLET | ORAL | 0 refills | Status: DC

## 2019-04-14 MED ORDER — AZITHROMYCIN 250 MG PO TABS: ORAL_TABLET | ORAL | 0 refills | Status: DC

## 2019-04-14 MED ORDER — HYDROCOD POLST-CPM POLST ER 10-8 MG/5ML PO SUER: 5.0000 mL | Freq: Two times a day (BID) | ORAL | 0 refills | Status: DC | PRN

## 2019-04-14 NOTE — Assessment & Plan Note (Signed)
She gets these flares due to her allergies- could be allergic asthma/bronchitis- will treat with prednisone burst, Zpack and tussionex given rib pain.  The fatigue concerns me so we agreed for her to get tested for covid.  I sent her the following via my chart:      The COVID-19 Community Testing sites will begin testing BY APPOINTMENT ONLY starting the week of December 14th (see go-live dates by location below). . In addition, starting the week of December 14th we will move INDOORS for testing, (see locations Below).  . Community testing site appointment hours will be 8 a.m. to 3:30 p.m.  If you/your practice has a patient that needs an appointment, please have the patient text "COVID" to [phone], OR they can log on to [email or web address] to easily make an on-line appointment.  If you have a patient who does not have access to a smart phone or PC, you or the patient can call [phone] to get assistance.  If you have a patient that needs a test and appointments are full, please send a message to the [facility] Community Testing in-basket or call [phone]. A provider may request that we overbook additional tests per day.  [place] Location: [address], [facility] (old [facility])   [facility] Location: [address], [place], [place] [address]  [address] [facility])   [place] [facility] Location: Medical illustrator (across from [place] ED)   Call or send my chart message prn if these symptoms worsen or fail to improve as anticipated. The patient indicates understanding of these issues and agrees with the plan.

## 2019-04-14 NOTE — Progress Notes (Signed)
Virtual Visit via Video   Due to the COVID-19 pandemic, this visit was completed with telemedicine (audio/video) technology to reduce patient and provider exposure as well as to preserve personal protective equipment.   I connected with [name] by a video enabled telemedicine application and verified that I am speaking with the correct person using two identifiers. Location patient: Home Location provider: [facility], Office Persons participating in the virtual visit: Mliss Fritz, MD   I discussed the limitations of evaluation and management by telemedicine and the availability of in person appointments. The patient expressed understanding and agreed to proceed.  Interactive audio and video telecommunications were attempted between this provider and patient, however failed, due to patient having technical difficulties OR patient did not have access to video capability.  We continued and completed visit with audio only.   Care Team   Patient Care Team: Dianne Dun, MD as PCP - General (Family Medicine)  Subjective:   HPI:   Cough-  Pt was seen for cough on 03/26/19 and our plan was as follows:   Prednisone burst has been sent to her pharmacy on file.  Also a prescription has been provided for Tussionex ER suspension, 5 mL every 12 hours over the next 3 days, then stop.  Since her GERD has not been under good control, we placed her back on Nexium which was more efficacious for her.  And given her history CAP, change in sputum and duration of symptoms, eRx also sent in for doxycyline.  Singulair was sent to her pharmacy to help with the respiratory allergic manifestations  The patient indicates understanding of these issues and agrees with the plan. Call or send my chart message prn if these symptoms worsen or fail to improve as anticipated. The patient indicates understanding of these issues and agrees with the plan.  She felt better  for awhile but now she is very fatigued.  Chills, no fever but she feels feverish.  Productive cough.   Having SOB, no myalgias No sore throat, headache, nausea, vomiting, diarrhea, or new loss of taste or smell. No known contacts with covid 19 or someone being tested for covid 19.   She does feel the addition of singulair helped with her allergies.  Review of Systems  Constitutional: Positive for chills and malaise/fatigue. Negative for diaphoresis, fever and weight loss.  HENT: Negative for congestion and hearing loss.   Eyes: Negative for blurred vision, discharge and redness.  Respiratory: Positive for cough, sputum production, shortness of breath and wheezing. Negative for hemoptysis.   Cardiovascular: Negative for chest pain, palpitations and leg swelling.  Gastrointestinal: Negative for abdominal pain and heartburn.  Genitourinary: Negative.  Negative for dysuria.  Musculoskeletal: Negative for falls.  Skin: Negative for rash.  Neurological: Negative.  Negative for loss of consciousness and headaches.  Endo/Heme/Allergies: Does not bruise/bleed easily.  Psychiatric/Behavioral: Negative.  Negative for depression.  All other systems reviewed and are negative.    Patient Active Problem List   Diagnosis Date Noted  . Cough variant asthma 04/14/2019  . Chronic cough 03/26/2019  . 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 Hair thinning 01/06/2019  . GERD (gastroesophageal reflux disease) 01/25/2016  . Elevated blood-pressure reading without diagnosis of hypertension 01/25/2016  . Family history of premature CAD 12/23/2013  . Unspecified constipation 04/19/2013  . Chronic fatigue 01/06/2013  . Insomnia 01/24/2011  . Depression   . Interstitial cystitis     Social History   Tobacco Use  .  Smoking status: Never Smoker  . Smokeless tobacco: Never Used  Substance Use Topics  . Alcohol use: Yes    Current Outpatient Medications:  .  azithromycin (ZITHROMAX) 250 MG tablet, 2 tabs by mouth  on day 1 followed by 1 tab by mouth days 2-5, Disp: 6 tablet, Rfl: 0 .  BLISOVI FE 1/20 1-20 MG-MCG tablet, TAKE 1 TABLET DAILY, Disp: 84 tablet, Rfl: 3 .  buPROPion (WELLBUTRIN XL) 300 MG 24 hr tablet, TAKE 1 TABLET DAILY, Disp: 90 tablet, Rfl: 3 .  chlorpheniramine-HYDROcodone (TUSSIONEX PENNKINETIC ER) 10-8 MG/5ML SUER, Take 5 mLs by mouth every 12 (twelve) hours as needed., Disp: 140 mL, Rfl: 0 .  diazepam (VALIUM) 5 MG tablet, TAKE 1-2 TABLETS BY MOUTH DAILY AS NEEDED FOR MUSCLE SPASMS, Disp: 30 tablet, Rfl: 2 .  doxycycline (VIBRA-TABS) 100 MG tablet, Take 1 tablet (100 mg total) by mouth 2 (two) times daily., Disp: 14 tablet, Rfl: 0 .  esomeprazole (NEXIUM) 40 MG capsule, Take 1 capsule (40 mg total) by mouth daily., Disp: 90 capsule, Rfl: 3 .  famotidine (PEPCID) 40 MG tablet, Take 1 tablet (40 mg total) by mouth daily., Disp: 90 tablet, Rfl: 3 .  fluconazole (DIFLUCAN) 150 MG tablet, Take 1 tablet (150 mg total) by mouth once for 1 dose., Disp: 1 tablet, Rfl: 0 .  hydrOXYzine (ATARAX/VISTARIL) 25 MG tablet, TAKE 1 TABLET BY MOUTH THREE TIMES A DAY, Disp: 270 tablet, Rfl: 1 .  lisinopril (ZESTRIL) 5 MG tablet, TAKE 1 TABLET BY MOUTH EVERY DAY, Disp: 30 tablet, Rfl: 0 .  methylphenidate (RITALIN) 20 MG tablet, Take 1 tablet (20 mg total) by mouth 2 (two) times daily., Disp: 180 tablet, Rfl: 0 .  modafinil (PROVIGIL) 100 MG tablet, Take 1 tablet (100 mg total) by mouth daily., Disp: 90 tablet, Rfl: 1 .  montelukast (SINGULAIR) 10 MG tablet, Take 1 tablet (10 mg total) by mouth at bedtime., Disp: 30 tablet, Rfl: 3 .  predniSONE (DELTASONE) 10 MG tablet, 3 tabs by mouth x 3 days, 2 tabs by mouth x 2 days, 1 tab by mouth x 2 days and stop., Disp: 15 tablet, Rfl: 0 .  predniSONE (DELTASONE) 10 MG tablet, 3 tabs by mouth x 3 days, 2 tabs by mouth x 2 days, 1 tab by mouth x 2 days and stop., Disp: 15 tablet, Rfl: 0  Allergies  Allergen Reactions  . Sulfa Antibiotics Hives  . Ortho Evra  [Norelgestromin-Eth Estradiol] Rash and Other (See Comments)    Elevated BP  . Shellfish Allergy Nausea And Vomiting    Objective:  Temp (!) 97.2 F (36.2 C)   VITALS: Per patient if applicable, see vitals. GENERAL: Alert, appears well and in no acute distress. HEENT: Atraumatic, conjunctiva clear, no obvious abnormalities on inspection of external nose and ears. NECK: Normal movements of the head and neck. CARDIOPULMONARY: No increased WOB. Speaking in clear sentences. I:E ratio WNL.  MS: Moves all visible extremities without noticeable abnormality. PSYCH: Pleasant and cooperative, well-groomed. Speech normal rate and rhythm. Affect is appropriate. Insight and judgement are appropriate. Attention is focused, linear, and appropriate.  NEURO: CN grossly intact. Oriented as arrived to appointment on time with no prompting. Moves both UE equally.  SKIN: No obvious lesions, wounds, erythema, or cyanosis noted on face or hands.  Depression screen [facility] 2/9 01/06/2019 08/05/2018 05/07/2017  Decreased Interest 0 1 0  Down, Depressed, Hopeless 0 1 2  PHQ - 2 Score 0 2 2  Altered sleeping -  1 1  Tired, decreased energy - 3 2  Change in appetite - 1 0  Feeling bad or failure about yourself  - 1 0  Trouble concentrating - 1 0  Moving slowly or fidgety/restless - 0 0  Suicidal thoughts - 0 0  PHQ-9 Score - 9 5  Difficult doing work/chores - - Somewhat difficult     . COVID-19 Education: The signs and symptoms of COVID-19 were discussed with the patient and how to seek care for testing if needed. The importance of social distancing was discussed today. . Reviewed expectations re: course of current medical issues. . Discussed self-management of symptoms. . Outlined signs and symptoms indicating need for more acute intervention. . Patient verbalized understanding and all questions were answered. Marland Kitchen Health Maintenance issues including appropriate healthy diet, exercise, and smoking avoidance were  discussed with patient. . See orders for this visit as documented in the electronic medical record.  Ruthe Mannan, MD  Records requested if needed. Time spent: 25 minutes, of which >50% was spent in obtaining information about her symptoms, reviewing her previous labs, evaluations, and treatments, counseling her about her condition (please see the discussed topics above), and developing a plan to further investigate it; she had a number of questions which I addressed.   Lab Results  Component Value Date   WBC 8.5 01/08/2019   HGB 13.3 01/08/2019   HCT 40.0 01/08/2019   PLT 390.0 01/08/2019   GLUCOSE 94 01/08/2019   CHOL 176 03/25/2015   TRIG 171.0 (H) 03/25/2015   HDL 56.70 03/25/2015   LDLDIRECT 145.4 12/23/2013   LDLCALC 85 03/25/2015   ALT 15 01/25/2016   AST 16 01/25/2016   NA 137 01/08/2019   K 3.9 01/08/2019   CL 103 01/08/2019   CREATININE 0.85 01/08/2019   BUN 14 01/08/2019   CO2 20 01/08/2019   TSH 2.45 01/08/2019    Lab Results  Component Value Date   TSH 2.45 01/08/2019   Lab Results  Component Value Date   WBC 8.5 01/08/2019   HGB 13.3 01/08/2019   HCT 40.0 01/08/2019   MCV 90.9 01/08/2019   PLT 390.0 01/08/2019   Lab Results  Component Value Date   NA 137 01/08/2019   K 3.9 01/08/2019   CO2 20 01/08/2019   GLUCOSE 94 01/08/2019   BUN 14 01/08/2019   CREATININE 0.85 01/08/2019   BILITOT 0.5 01/25/2016   ALKPHOS 88 01/25/2016   AST 16 01/25/2016   ALT 15 01/25/2016   PROT 7.7 01/25/2016   ALBUMIN 4.1 01/25/2016   CALCIUM 9.2 01/08/2019   GFR 74.81 01/08/2019   Lab Results  Component Value Date   CHOL 176 03/25/2015   Lab Results  Component Value Date   HDL 56.70 03/25/2015   Lab Results  Component Value Date   LDLCALC 85 03/25/2015   Lab Results  Component Value Date   TRIG 171.0 (H) 03/25/2015   Lab Results  Component Value Date   CHOLHDL 3 03/25/2015   No results found for: HGBA1C     Assessment & Plan:   Problem List  Items Addressed This Visit      Active Problems   Cough variant asthma    She gets these flares due to her allergies- could be allergic asthma/bronchitis- will treat with prednisone burst, Zpack and tussionex given rib pain.  The fatigue concerns me so we agreed for her to get tested for covid.  I sent her the following via my chart:  The COVID-19 Community Testing sites will begin testing BY APPOINTMENT ONLY starting the week of December 14th (see go-live dates by location below). . In addition, starting the week of December 14th we will move INDOORS for testing, (see locations Below).  . Community testing site appointment hours will be 8 a.m. to 3:30 p.m.  If you/your practice has a patient that needs an appointment, please have the patient text "COVID" to [phone], OR they can log on to [email or web address] to easily make an on-line appointment.  If you have a patient who does not have access to a smart phone or PC, you or the patient can call [phone] to get assistance.  If you have a patient that needs a test and appointments are full, please send a message to the [place] or call [phone]. A provider may request that we overbook additional tests per day.  [place] Location: [address] [place] (old [facility])   [facility] Location: [address] [place], [place] [address]  [place] ([place])   [facility] [facility] Location: Optician, dispensing (across from [place] ED)   Call or send my chart message prn if these symptoms worsen or fail to improve as anticipated. The patient indicates understanding of these issues and agrees with the plan.          Relevant Medications   predniSONE (DELTASONE) 10 MG tablet      I am having Lorn Junes start on predniSONE, azithromycin, and fluconazole. I am also having her maintain her diazepam, hydrOXYzine, methylphenidate, Blisovi FE 1/20, modafinil,  famotidine, lisinopril, buPROPion, esomeprazole, montelukast, predniSONE, doxycycline, and chlorpheniramine-HYDROcodone.  Meds ordered this encounter  Medications  . chlorpheniramine-HYDROcodone (TUSSIONEX PENNKINETIC ER) 10-8 MG/5ML SUER    Sig: Take 5 mLs by mouth every 12 (twelve) hours as needed.    Dispense:  140 mL    Refill:  0  . predniSONE (DELTASONE) 10 MG tablet    Sig: 3 tabs by mouth x 3 days, 2 tabs by mouth x 2 days, 1 tab by mouth x 2 days and stop.    Dispense:  15 tablet    Refill:  0  . azithromycin (ZITHROMAX) 250 MG tablet    Sig: 2 tabs by mouth on day 1 followed by 1 tab by mouth days 2-5    Dispense:  6 tablet    Refill:  0  . fluconazole (DIFLUCAN) 150 MG tablet    Sig: Take 1 tablet (150 mg total) by mouth once for 1 dose.    Dispense:  1 tablet    Refill:  0     Arnette Norris, MD

## 2019-04-15 ENCOUNTER — Ambulatory Visit: Payer: BC Managed Care – PPO | Attending: Internal Medicine

## 2019-04-15 DIAGNOSIS — Z20822 Contact with and (suspected) exposure to covid-19: Secondary | ICD-10-CM

## 2019-04-17 LAB — NOVEL CORONAVIRUS, NAA: SARS-CoV-2, NAA: NOT DETECTED

## 2019-04-20 ENCOUNTER — Encounter: Payer: Self-pay | Admitting: Family Medicine

## 2019-04-20 ENCOUNTER — Other Ambulatory Visit: Payer: Self-pay

## 2019-04-20 DIAGNOSIS — R030 Elevated blood-pressure reading, without diagnosis of hypertension: Secondary | ICD-10-CM

## 2019-04-20 MED ORDER — LISINOPRIL 5 MG PO TABS: 5.0000 mg | ORAL_TABLET | Freq: Every day | ORAL | 5 refills | Status: DC

## 2019-04-21 ENCOUNTER — Other Ambulatory Visit: Payer: Self-pay

## 2019-04-21 MED ORDER — HYDROXYZINE HCL 25 MG PO TABS: 25.0000 mg | ORAL_TABLET | Freq: Three times a day (TID) | ORAL | 3 refills | Status: DC

## 2019-04-21 MED ORDER — ESOMEPRAZOLE MAGNESIUM 40 MG PO CPDR: 40.0000 mg | DELAYED_RELEASE_CAPSULE | Freq: Every day | ORAL | 3 refills | Status: DC

## 2019-04-21 MED ORDER — FAMOTIDINE 40 MG PO TABS: 40.0000 mg | ORAL_TABLET | Freq: Every day | ORAL | 3 refills | Status: DC

## 2019-04-21 MED ORDER — MONTELUKAST SODIUM 10 MG PO TABS: 10.0000 mg | ORAL_TABLET | Freq: Every day | ORAL | 3 refills | Status: DC

## 2019-04-21 NOTE — Progress Notes (Signed)
Virtual Visit via Video   Due to the COVID-19 pandemic, this visit was completed with telemedicine (audio/video) technology to reduce patient and provider exposure as well as to preserve personal protective equipment.   I connected with Anna Burke by a video enabled telemedicine application and verified that I am speaking with the correct person using two identifiers. Location patient: Home Location provider: [facility], Office Persons participating in the virtual visit: Mliss Fritz, MD   I discussed the limitations of evaluation and management by telemedicine and the availability of in person appointments. The patient expressed understanding and agreed to proceed.  Interactive audio and video telecommunications were attempted between this provider and patient, however failed, due to patient having technical difficulties OR patient did not have access to video capability.  We continued and completed visit with audio only.  Care Team   Patient Care Team: Dianne Dun, MD as PCP - General (Family Medicine)  Subjective:   HPI: Patient agrees to virtual visit. She is C/O side pain.     I saw her via virtual visit on 04/14/19- note reviewed- treated her with prednisone burst, Zpack and tussionex given rib pain for asthma/bronchitis flare.  The fatigue concerned me so we agreed for her to get tested for covid, which was negative on 04/15/19.  See her MyChart message below from two days ago:  "The coughing is significantly better and I definitely can breathe better now. Coughing up stuff a bit still, but it feels like my sinuses and lungs are clearing out more so.  However I am still having a pain on my left side on my bra line/ under my left breast that isn't getting better at all. I'm not sure if it is just a rib fracture that is particularly painful or something else I should be concerned about. It's painful to lean over or lie down in certain directions and  of course when I cough. It is a sharp pain when I move but also a lingering soreness. I've been occasionally taking ibuprofen and it seems to help some but definitely doesn't stop the pain altogether. Is this something I should come in for? It feels sort of different and more intense than when I've injured myself coughing before."  Review of Systems  Constitutional: Negative.   HENT: Negative for congestion and hearing loss.   Eyes: Negative.  Negative for blurred vision and discharge.  Respiratory: Positive for cough. Negative for hemoptysis, sputum production, shortness of breath and wheezing.   Cardiovascular: Negative.  Negative for chest pain.  Gastrointestinal: Negative.  Negative for heartburn.  Genitourinary: Negative.  Negative for dysuria.  Musculoskeletal: Negative.  Negative for falls and myalgias.       +rib pain  Skin: Negative for rash.  Neurological: Negative for dizziness and loss of consciousness.  Endo/Heme/Allergies: Negative.  Does not bruise/bleed easily.  Psychiatric/Behavioral: Negative.  Negative for depression and memory loss.  All other systems reviewed and are negative.    Patient Active Problem List   Diagnosis Date Noted  . Rib pain on left side 04/22/2019  . Cough variant asthma 04/14/2019  . Chronic cough 03/26/2019  . 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 Hair thinning 01/06/2019  . GERD (gastroesophageal reflux disease) 01/25/2016  . Elevated blood-pressure reading without diagnosis of hypertension 01/25/2016  . Family history of premature CAD 12/23/2013  . Unspecified constipation 04/19/2013  . Chronic fatigue 01/06/2013  . Insomnia 01/24/2011  . Depression   . Interstitial  cystitis     Social History   Tobacco Use  . Smoking status: Never Smoker  . Smokeless tobacco: Never Used  Substance Use Topics  . Alcohol use: Yes    Current Outpatient Medications:  .  BLISOVI FE 1/20 1-20 MG-MCG tablet, TAKE 1 TABLET DAILY, Disp: 84 tablet, Rfl: 3 .   buPROPion (WELLBUTRIN XL) 300 MG 24 hr tablet, TAKE 1 TABLET DAILY, Disp: 90 tablet, Rfl: 3 .  chlorpheniramine-HYDROcodone (TUSSIONEX PENNKINETIC ER) 10-8 MG/5ML SUER, Take 5 mLs by mouth every 12 (twelve) hours as needed., Disp: 140 mL, Rfl: 0 .  diazepam (VALIUM) 5 MG tablet, TAKE 1-2 TABLETS BY MOUTH DAILY AS NEEDED FOR MUSCLE SPASMS, Disp: 30 tablet, Rfl: 2 .  esomeprazole (NEXIUM) 40 MG capsule, Take 1 capsule (40 mg total) by mouth daily., Disp: 90 capsule, Rfl: 3 .  famotidine (PEPCID) 40 MG tablet, Take 1 tablet (40 mg total) by mouth daily., Disp: 90 tablet, Rfl: 3 .  hydrOXYzine (ATARAX/VISTARIL) 25 MG tablet, Take 1 tablet (25 mg total) by mouth 3 (three) times daily., Disp: 270 tablet, Rfl: 3 .  lisinopril (ZESTRIL) 5 MG tablet, Take 1 tablet (5 mg total) by mouth daily., Disp: 30 tablet, Rfl: 5 .  methylphenidate (RITALIN) 20 MG tablet, Take 1 tablet (20 mg total) by mouth 2 (two) times daily., Disp: 180 tablet, Rfl: 0 .  modafinil (PROVIGIL) 100 MG tablet, Take 1 tablet (100 mg total) by mouth daily., Disp: 90 tablet, Rfl: 1 .  montelukast (SINGULAIR) 10 MG tablet, Take 1 tablet (10 mg total) by mouth at bedtime., Disp: 90 tablet, Rfl: 3 .  oxyCODONE-acetaminophen (PERCOCET) 5-325 MG tablet, Take 1 tablet by mouth every 4 (four) hours as needed for up to 7 days for severe pain., Disp: 30 tablet, Rfl: 0  Allergies  Allergen Reactions  . Sulfa Antibiotics Hives  . Ortho Evra [Norelgestromin-Eth Estradiol] Rash and Other (See Comments)    Elevated BP  . Shellfish Allergy Nausea And Vomiting    Objective:  Temp 98 F (36.7 C) (Oral)   VITALS: Per patient if applicable, see vitals. GENERAL: Alert, appears well and in no acute distress. HEENT: Atraumatic, conjunctiva clear, no obvious abnormalities on inspection of external nose and ears. NECK: Normal movements of the head and neck. CARDIOPULMONARY: No increased WOB. Speaking in clear sentences. I:E ratio WNL.  MS: Moves all  visible extremities without noticeable abnormality. PSYCH: Pleasant and cooperative, well-groomed. Speech normal rate and rhythm. Affect is appropriate. Insight and judgement are appropriate. Attention is focused, linear, and appropriate.  NEURO: CN grossly intact. Oriented as arrived to appointment on time with no prompting. Moves both UE equally.  SKIN: No obvious lesions, wounds, erythema, or cyanosis noted on face or hands.  Depression screen [facility] 2/9 01/06/2019 08/05/2018 05/07/2017  Decreased Interest 0 1 0  Down, Depressed, Hopeless 0 1 2  PHQ - 2 Score 0 2 2  Altered sleeping - 1 1  Tired, decreased energy - 3 2  Change in appetite - 1 0  Feeling bad or failure about yourself  - 1 0  Trouble concentrating - 1 0  Moving slowly or fidgety/restless - 0 0  Suicidal thoughts - 0 0  PHQ-9 Score - 9 5  Difficult doing work/chores - - Somewhat difficult     . COVID-19 Education: The signs and symptoms of COVID-19 were discussed with the patient and how to seek care for testing if needed. The importance of social distancing was discussed today. Marland Kitchen  Reviewed expectations re: course of current medical issues. . Discussed self-management of symptoms. . Outlined signs and symptoms indicating need for more acute intervention. . Patient verbalized understanding and all questions were answered. Marland Kitchen Health Maintenance issues including appropriate healthy diet, exercise, and smoking avoidance were discussed with patient. . See orders for this visit as documented in the electronic medical record.  Ruthe Mannan, MD  Records requested if needed. Time spent: 25 minutes, of which >50% was spent in obtaining information about her symptoms, reviewing her previous labs, evaluations, and treatments, counseling her about her condition (please see the discussed topics above), and developing a plan to further investigate it; she had a number of questions which I addressed.   Lab Results  Component Value Date    WBC 8.5 01/08/2019   HGB 13.3 01/08/2019   HCT 40.0 01/08/2019   PLT 390.0 01/08/2019   GLUCOSE 94 01/08/2019   CHOL 176 03/25/2015   TRIG 171.0 (H) 03/25/2015   HDL 56.70 03/25/2015   LDLDIRECT 145.4 12/23/2013   LDLCALC 85 03/25/2015   ALT 15 01/25/2016   AST 16 01/25/2016   NA 137 01/08/2019   K 3.9 01/08/2019   CL 103 01/08/2019   CREATININE 0.85 01/08/2019   BUN 14 01/08/2019   CO2 20 01/08/2019   TSH 2.45 01/08/2019    Lab Results  Component Value Date   TSH 2.45 01/08/2019   Lab Results  Component Value Date   WBC 8.5 01/08/2019   HGB 13.3 01/08/2019   HCT 40.0 01/08/2019   MCV 90.9 01/08/2019   PLT 390.0 01/08/2019   Lab Results  Component Value Date   NA 137 01/08/2019   K 3.9 01/08/2019   CO2 20 01/08/2019   GLUCOSE 94 01/08/2019   BUN 14 01/08/2019   CREATININE 0.85 01/08/2019   BILITOT 0.5 01/25/2016   ALKPHOS 88 01/25/2016   AST 16 01/25/2016   ALT 15 01/25/2016   PROT 7.7 01/25/2016   ALBUMIN 4.1 01/25/2016   CALCIUM 9.2 01/08/2019   GFR 74.81 01/08/2019   Lab Results  Component Value Date   CHOL 176 03/25/2015   Lab Results  Component Value Date   HDL 56.70 03/25/2015   Lab Results  Component Value Date   LDLCALC 85 03/25/2015   Lab Results  Component Value Date   TRIG 171.0 (H) 03/25/2015   Lab Results  Component Value Date   CHOLHDL 3 03/25/2015   No results found for: HGBA1C     Assessment & Plan:   Problem List Items Addressed This Visit      Active Problems   Cough variant asthma - Primary   Rib pain on left side    Likely rib fracture which simply takes time to heal.  We should make sure that she has not punctured a lung by getting a rib xray- future order placed.  Her asthma exacerbation otherwise seems to be improving.      Relevant Orders   DG Ribs Unilateral Left      I have discontinued Lanora Manis C. Marcotte's predniSONE, doxycycline, predniSONE, and azithromycin. I am also having her start on  oxyCODONE-acetaminophen. Additionally, I am having her maintain her diazepam, methylphenidate, Blisovi FE 1/20, modafinil, buPROPion, chlorpheniramine-HYDROcodone, lisinopril, hydrOXYzine, famotidine, montelukast, and esomeprazole.  Meds ordered this encounter  Medications  . oxyCODONE-acetaminophen (PERCOCET) 5-325 MG tablet    Sig: Take 1 tablet by mouth every 4 (four) hours as needed for up to 7 days for severe pain.  Dispense:  30 tablet    Refill:  0     Ruthe Mannan, MD

## 2019-04-22 ENCOUNTER — Telehealth (INDEPENDENT_AMBULATORY_CARE_PROVIDER_SITE_OTHER): Payer: BC Managed Care – PPO | Admitting: Family Medicine

## 2019-04-22 ENCOUNTER — Encounter: Payer: Self-pay | Admitting: Family Medicine

## 2019-04-22 ENCOUNTER — Other Ambulatory Visit: Payer: Self-pay

## 2019-04-22 ENCOUNTER — Ambulatory Visit (INDEPENDENT_AMBULATORY_CARE_PROVIDER_SITE_OTHER): Payer: BC Managed Care – PPO

## 2019-04-22 ENCOUNTER — Other Ambulatory Visit: Payer: BC Managed Care – PPO

## 2019-04-22 VITALS — Temp 98.0°F

## 2019-04-22 DIAGNOSIS — R0781 Pleurodynia: Secondary | ICD-10-CM

## 2019-04-22 DIAGNOSIS — J45991 Cough variant asthma: Secondary | ICD-10-CM | POA: Diagnosis not present

## 2019-04-22 MED ORDER — OXYCODONE-ACETAMINOPHEN 5-325 MG PO TABS: 1.0000 | ORAL_TABLET | ORAL | 0 refills | Status: AC | PRN

## 2019-04-22 NOTE — Assessment & Plan Note (Signed)
Likely rib fracture which simply takes time to heal.  We should make sure that she has not punctured a lung by getting a rib xray- future order placed.  Her asthma exacerbation otherwise seems to be improving.

## 2019-05-07 ENCOUNTER — Other Ambulatory Visit: Payer: Self-pay

## 2019-05-07 MED ORDER — FAMOTIDINE 40 MG PO TABS: 40.0000 mg | ORAL_TABLET | Freq: Every day | ORAL | 3 refills | Status: DC

## 2019-05-07 MED ORDER — MONTELUKAST SODIUM 10 MG PO TABS: 10.0000 mg | ORAL_TABLET | Freq: Every day | ORAL | 3 refills | Status: DC

## 2019-05-07 MED ORDER — ESOMEPRAZOLE MAGNESIUM 40 MG PO CPDR: 40.0000 mg | DELAYED_RELEASE_CAPSULE | Freq: Every day | ORAL | 3 refills | Status: DC

## 2019-05-12 ENCOUNTER — Other Ambulatory Visit: Payer: BC Managed Care – PPO

## 2019-05-14 ENCOUNTER — Ambulatory Visit: Payer: BC Managed Care – PPO | Attending: Internal Medicine

## 2019-05-14 ENCOUNTER — Other Ambulatory Visit: Payer: Self-pay

## 2019-05-14 DIAGNOSIS — Z20822 Contact with and (suspected) exposure to covid-19: Secondary | ICD-10-CM

## 2019-05-14 NOTE — Addendum Note (Signed)
Addended by: Fayrene Helper on: 05/14/2019 10:40 AM   Modules accepted: Orders

## 2019-05-15 LAB — NOVEL CORONAVIRUS, NAA: SARS-CoV-2, NAA: NOT DETECTED

## 2019-06-17 ENCOUNTER — Ambulatory Visit: Payer: BC Managed Care – PPO | Attending: Internal Medicine

## 2019-06-17 DIAGNOSIS — Z20822 Contact with and (suspected) exposure to covid-19: Secondary | ICD-10-CM

## 2019-06-18 LAB — NOVEL CORONAVIRUS, NAA: SARS-CoV-2, NAA: NOT DETECTED

## 2019-07-24 ENCOUNTER — Other Ambulatory Visit: Payer: Self-pay

## 2019-07-24 MED ORDER — NORETHIN ACE-ETH ESTRAD-FE 1-20 MG-MCG PO TABS: 1.0000 | ORAL_TABLET | Freq: Every day | ORAL | 3 refills | Status: DC

## 2019-07-30 ENCOUNTER — Other Ambulatory Visit: Payer: Self-pay

## 2019-07-30 ENCOUNTER — Telehealth (INDEPENDENT_AMBULATORY_CARE_PROVIDER_SITE_OTHER): Payer: BC Managed Care – PPO | Admitting: Family Medicine

## 2019-07-30 ENCOUNTER — Encounter: Payer: Self-pay | Admitting: Family Medicine

## 2019-07-30 VITALS — Ht 63.5 in | Wt 220.0 lb

## 2019-07-30 DIAGNOSIS — F419 Anxiety disorder, unspecified: Secondary | ICD-10-CM | POA: Diagnosis not present

## 2019-07-30 DIAGNOSIS — R5382 Chronic fatigue, unspecified: Secondary | ICD-10-CM | POA: Diagnosis not present

## 2019-07-30 MED ORDER — DIAZEPAM 5 MG PO TABS: ORAL_TABLET | ORAL | 0 refills | Status: DC

## 2019-07-30 MED ORDER — METHYLPHENIDATE HCL 20 MG PO TABS: 20.0000 mg | ORAL_TABLET | Freq: Two times a day (BID) | ORAL | 0 refills | Status: DC

## 2019-07-30 MED ORDER — MODAFINIL 100 MG PO TABS: 100.0000 mg | ORAL_TABLET | Freq: Every day | ORAL | 1 refills | Status: DC

## 2019-07-30 NOTE — Progress Notes (Signed)
Virtual Visit via Video Note  I connected with Anna Burke on 07/30/19 at 10:00 AM EDT by a video enabled telemedicine application and verified that I am speaking with the correct person using two identifiers. Location patient: home Location provider: work  Persons participating in the virtual visit: patient, provider  I discussed the limitations of evaluation and management by telemedicine and the availability of in person appointments. The patient expressed understanding and agreed to proceed.  Chief Complaint  Patient presents with  . Follow-up    med refills-Diazepam, Methylphenidate, Modafinil, paper said she had some paperwork for work-she missed days for covid vaccine 07/14/19 an she was wonderin if ok to send on mychart.     HPI: Anna Burke is a 39 y.o. female here for medication refills - valium 5mg  PRN, provigil 100mg  daily, ritalin 20mg  BID. These are chronic meds and pt has been stable on them. No side effects. Appetite is good, sleep is ok.   Pt works for an [employer] under[employer] ([employer]) and she states they have a strict attendance policy. She has paperwork to complete for days missed following covid vaccine. She has 2nd dose on 07/14/19. She took sick days 4/10-11, 4/15. She notes myalgias, fatigue.    Past Medical History:  Diagnosis Date  . Depression   . Insomnia    Sleep study on 09/03/2009 by Dr. Annamaria Boots consistent with insomnia but no organic sleep disturbance identified.  . Interstitial cystitis    Dr. Amalia Hailey [facility]    History reviewed. No pertinent surgical history.  Family History  Problem Relation Age of Onset  . Arthritis Mother   . Heart disease Mother   . Arthritis Father     Social History   Tobacco Use  . Smoking status: Never Smoker  . Smokeless tobacco: Never Used  Substance Use Topics  . Alcohol use: Yes  . Drug use: No     Current Outpatient Medications:  .  buPROPion (WELLBUTRIN XL) 300 MG 24 hr tablet, TAKE 1 TABLET DAILY, Disp:  90 tablet, Rfl: 3 .  diazepam (VALIUM) 5 MG tablet, TAKE 1-2 TABLETS BY MOUTH DAILY AS NEEDED FOR MUSCLE SPASMS, Disp: 30 tablet, Rfl: 2 .  esomeprazole (NEXIUM) 40 MG capsule, Take 1 capsule (40 mg total) by mouth daily., Disp: 90 capsule, Rfl: 3 .  famotidine (PEPCID) 40 MG tablet, Take 1 tablet (40 mg total) by mouth daily., Disp: 90 tablet, Rfl: 3 .  hydrOXYzine (ATARAX/VISTARIL) 25 MG tablet, Take 1 tablet (25 mg total) by mouth 3 (three) times daily., Disp: 270 tablet, Rfl: 3 .  lisinopril (ZESTRIL) 5 MG tablet, Take 1 tablet (5 mg total) by mouth daily., Disp: 30 tablet, Rfl: 5 .  methylphenidate (RITALIN) 20 MG tablet, Take 1 tablet (20 mg total) by mouth 2 (two) times daily., Disp: 180 tablet, Rfl: 0 .  modafinil (PROVIGIL) 100 MG tablet, Take 1 tablet (100 mg total) by mouth daily., Disp: 90 tablet, Rfl: 1 .  montelukast (SINGULAIR) 10 MG tablet, Take 1 tablet (10 mg total) by mouth at bedtime., Disp: 90 tablet, Rfl: 3 .  norethindrone-ethinyl estradiol (BLISOVI FE 1/20) 1-20 MG-MCG tablet, Take 1 tablet by mouth daily., Disp: 84 tablet, Rfl: 3 .  chlorpheniramine-HYDROcodone (TUSSIONEX PENNKINETIC ER) 10-8 MG/5ML SUER, Take 5 mLs by mouth every 12 (twelve) hours as needed. (Patient not taking: Reported on 07/30/2019), Disp: 140 mL, Rfl: 0  Allergies  Allergen Reactions  . Sulfa Antibiotics Hives  . Ortho Evra [Norelgestromin-Eth Estradiol] Rash and Other (  See Comments)    Elevated BP  . Shellfish Allergy Nausea And Vomiting      ROS: See pertinent positives and negatives per HPI.   EXAM:  VITALS per patient if applicable: Ht 5' 3.5" (1.613 m)   Wt 220 lb (99.8 kg) Comment: pt reported  BMI 38.36 kg/m    GENERAL: alert, oriented, appears well and in no acute distress  HEENT: atraumatic, conjunctiva clear, no obvious abnormalities on inspection of external nose and ears  NECK: normal movements of the head and neck  LUNGS: on inspection no signs of respiratory distress,  breathing rate appears normal, no obvious gross SOB, gasping or wheezing, no conversational dyspnea  CV: no obvious cyanosis  MS: moves all visible extremities without noticeable abnormality  PSYCH/NEURO: pleasant and cooperative, no obvious depression or anxiety, speech and thought processing grossly intact   ASSESSMENT AND PLAN: 1. Chronic fatigue - stable, well-controlled Refill: - methylphenidate (RITALIN) 20 MG tablet; Take 1 tablet (20 mg total) by mouth 2 (two) times daily.  Dispense: 180 tablet; Refill: 0 - modafinil (PROVIGIL) 100 MG tablet; Take 1 tablet (100 mg total) by mouth daily.  Dispense: 90 tablet; Refill: 1 - database reviewed and appropriate - needs UDS and updated controlled substance agreement  - f/u in 3 mo - in-person so we can update UDS and agreement   2. Anxiety - stable - cont wellbutrin Refill: - diazepam (VALIUM) 5 MG tablet; TAKE 1-2 TABLETS BY MOUTH DAILY AS NEEDED FOR MUSCLE SPASMS  Dispense: 30 tablet; Refill: 0 - database reviewed and appropriate - needs UDS and updated controlled substance agreement  - f/u in 3 mo - in-person so we can update UDS and agreement    I discussed the assessment and treatment plan with the patient. The patient was provided an opportunity to ask questions and all were answered. The patient agreed with the plan and demonstrated an understanding of the instructions.   The patient was advised to call back or seek an in-person evaluation if the symptoms worsen or if the condition fails to improve as anticipated.   Luana Shu, DO

## 2019-08-14 ENCOUNTER — Other Ambulatory Visit: Payer: Self-pay | Admitting: Family Medicine

## 2019-08-14 DIAGNOSIS — F419 Anxiety disorder, unspecified: Secondary | ICD-10-CM

## 2019-08-17 NOTE — Telephone Encounter (Signed)
Last OV 07/30/19 Last fill 07/30/19

## 2019-11-20 ENCOUNTER — Other Ambulatory Visit: Payer: Self-pay | Admitting: Family Medicine

## 2019-11-20 DIAGNOSIS — F419 Anxiety disorder, unspecified: Secondary | ICD-10-CM

## 2019-11-25 ENCOUNTER — Other Ambulatory Visit: Payer: Self-pay | Admitting: Critical Care Medicine

## 2019-11-25 ENCOUNTER — Other Ambulatory Visit: Payer: BC Managed Care – PPO

## 2019-11-25 DIAGNOSIS — Z20822 Contact with and (suspected) exposure to covid-19: Secondary | ICD-10-CM

## 2019-11-27 LAB — SARS-COV-2, NAA 2 DAY TAT

## 2019-11-27 LAB — NOVEL CORONAVIRUS, NAA: SARS-CoV-2, NAA: NOT DETECTED

## 2019-11-27 NOTE — Telephone Encounter (Signed)
Medication refill request.

## 2019-12-10 ENCOUNTER — Other Ambulatory Visit: Payer: Self-pay | Admitting: Family Medicine

## 2019-12-10 DIAGNOSIS — R030 Elevated blood-pressure reading, without diagnosis of hypertension: Secondary | ICD-10-CM

## 2019-12-10 NOTE — Telephone Encounter (Signed)
Medication refill request for Lisinopril  07/30/19 Last OV  04/20/19 Last refill date // #30 // refills -5  Refill request approved and submitted to pharmacy per standing orders.

## 2019-12-18 ENCOUNTER — Other Ambulatory Visit: Payer: Self-pay

## 2019-12-18 ENCOUNTER — Other Ambulatory Visit: Payer: BC Managed Care – PPO

## 2019-12-18 DIAGNOSIS — Z20822 Contact with and (suspected) exposure to covid-19: Secondary | ICD-10-CM

## 2019-12-21 LAB — NOVEL CORONAVIRUS, NAA: SARS-CoV-2, NAA: NOT DETECTED

## 2020-01-01 ENCOUNTER — Telehealth: Payer: Self-pay

## 2020-01-01 DIAGNOSIS — F419 Anxiety disorder, unspecified: Secondary | ICD-10-CM

## 2020-01-01 MED ORDER — BUPROPION HCL ER (XL) 300 MG PO TB24: 300.0000 mg | ORAL_TABLET | Freq: Every day | ORAL | 3 refills | Status: DC

## 2020-01-01 NOTE — Telephone Encounter (Signed)
Dr. Salena Saner please advise. We received a [facility] for:  Bupropion  90 tabs    3-refills Last fill- 02/16/2019 Last OV-07/30/2019  Pt isn't out till 02/16/2020 but [facility] always send request out when last refill is filled. I did try to contact pt and sent a my chart message letting her to know that she is due for a 3 month follow up and to update her UDS an contract agreement.

## 2020-01-01 NOTE — Addendum Note (Signed)
Addended by: Overton Mam on: 01/01/2020 12:41 PM   Modules accepted: Orders

## 2020-01-01 NOTE — Telephone Encounter (Signed)
[facility]

## 2020-01-27 ENCOUNTER — Encounter: Payer: Self-pay | Admitting: Family Medicine

## 2020-01-27 ENCOUNTER — Telehealth (INDEPENDENT_AMBULATORY_CARE_PROVIDER_SITE_OTHER): Payer: BC Managed Care – PPO | Admitting: Family Medicine

## 2020-01-27 VITALS — Ht 63.5 in | Wt 220.0 lb

## 2020-01-27 DIAGNOSIS — Z79899 Other long term (current) drug therapy: Secondary | ICD-10-CM

## 2020-01-27 DIAGNOSIS — N3010 Interstitial cystitis (chronic) without hematuria: Secondary | ICD-10-CM | POA: Diagnosis not present

## 2020-01-27 DIAGNOSIS — R5382 Chronic fatigue, unspecified: Secondary | ICD-10-CM

## 2020-01-27 DIAGNOSIS — F5104 Psychophysiologic insomnia: Secondary | ICD-10-CM | POA: Diagnosis not present

## 2020-01-27 MED ORDER — MODAFINIL 100 MG PO TABS: 100.0000 mg | ORAL_TABLET | Freq: Every day | ORAL | 0 refills | Status: DC

## 2020-01-27 MED ORDER — METHYLPHENIDATE HCL 20 MG PO TABS: 20.0000 mg | ORAL_TABLET | Freq: Two times a day (BID) | ORAL | 0 refills | Status: DC

## 2020-01-27 NOTE — Progress Notes (Signed)
Virtual Visit via Video Note  I connected with [name] on 01/27/20 at  4:00 PM EDT by a video enabled telemedicine application and verified that I am speaking with the correct person using two identifiers. Location patient: home Location provider: work  Persons participating in the virtual visit: patient, provider  I discussed the limitations of evaluation and management by telemedicine and the availability of in person appointments. The patient expressed understanding and agreed to proceed.  Chief Complaint  Patient presents with  . Follow-up    f/u Chronic fatigue/anxiety and discuss paperwork.      HPI: Anna Burke is a 39 y.o. female seen today for f/u on chronic medical issues including insomnia, chronic fatigue as well as medication refills.   Pt states she took 5 sick days last week for IC flare - 10/8-10/11, 01/22/20. She is not sure of what triggered it.  She works 10 hours ( break, 2 breaks) but has to urinate more frequently than that.  She will have papers from work that need to be completed and will send them once she receives them from work.   Past Medical History:  Diagnosis Date  . Depression   . Insomnia    Sleep study on 09/03/2009 by Dr. Maple Hudson consistent with insomnia but no organic sleep disturbance identified.  . Interstitial cystitis    Dr. [facility]    History reviewed. No pertinent surgical history.  Family History  Problem Relation Age of Onset  . Arthritis Mother   . Heart disease Mother   . Arthritis Father     Social History   Tobacco Use  . Smoking status: Never Smoker  . Smokeless tobacco: Never Used  Substance Use Topics  . Alcohol use: Yes  . Drug use: No     Current Outpatient Medications:  .  buPROPion (WELLBUTRIN XL) 300 MG 24 hr tablet, Take 1 tablet (300 mg total) by mouth daily., Disp: 90 tablet, Rfl: 3 .  diazepam (VALIUM) 5 MG tablet, TAKE 1 TABLET DAILY AS NEEDED, Disp: 30 tablet, Rfl: 0 .   esomeprazole (NEXIUM) 40 MG capsule, Take 1 capsule (40 mg total) by mouth daily., Disp: 90 capsule, Rfl: 3 .  famotidine (PEPCID) 40 MG tablet, Take 1 tablet (40 mg total) by mouth daily., Disp: 90 tablet, Rfl: 3 .  hydrOXYzine (ATARAX/VISTARIL) 25 MG tablet, Take 1 tablet (25 mg total) by mouth 3 (three) times daily., Disp: 270 tablet, Rfl: 3 .  lisinopril (ZESTRIL) 5 MG tablet, TAKE 1 TABLET DAILY, Disp: 90 tablet, Rfl: 3 .  norethindrone-ethinyl estradiol (BLISOVI FE 1/20) 1-20 MG-MCG tablet, Take 1 tablet by mouth daily., Disp: 84 tablet, Rfl: 3 .  methylphenidate (RITALIN) 20 MG tablet, Take 1 tablet (20 mg total) by mouth 2 (two) times daily., Disp: 180 tablet, Rfl: 0 .  modafinil (PROVIGIL) 100 MG tablet, Take 1 tablet (100 mg total) by mouth daily., Disp: 90 tablet, Rfl: 0  Allergies  Allergen Reactions  . Sulfa Antibiotics Hives  . Ortho Evra [Norelgestromin-Eth Estradiol] Rash and Other (See Comments)    Elevated BP  . Shellfish Allergy Nausea And Vomiting      ROS: See pertinent positives and negatives per HPI.   EXAM:  VITALS per patient if applicable: Ht 5' 3.5" (1.613 m)   Wt 220 lb (99.8 kg) Comment: pt reported  BMI 38.36 kg/m    GENERAL: alert, oriented, appears well and in no acute distress  HEENT: atraumatic, conjunctiva clear, no obvious  abnormalities on inspection of external nose and ears  NECK: normal movements of the head and neck  LUNGS: on inspection no signs of respiratory distress, breathing rate appears normal, no obvious gross SOB, gasping or wheezing, no conversational dyspnea  CV: no obvious cyanosis  PSYCH/NEURO: pleasant and cooperative, speech and thought processing grossly intact   ASSESSMENT AND PLAN: 1. Chronic fatigue 2. Psychophysiological insomnia - stable, at baseline on current meds/dosages - database reviewed and appropriate - due for UDS and updated controlled substance agreement at next OV in 3 mo Refill: -  methylphenidate (RITALIN) 20 MG tablet; Take 1 tablet (20 mg total) by mouth 2 (two) times daily.  Dispense: 180 tablet; Refill: 0 - modafinil (PROVIGIL) 100 MG tablet; Take 1 tablet (100 mg total) by mouth daily.  Dispense: 90 tablet; Refill: 0 - f/u in 3 mo or sooner PRN  3. High risk medications (not anticoagulants) long-term use - DRUG MONITORING, PANEL 8 WITH CONFIRMATION, URINE; Future  4. Interstitial cystitis - had recent flare and missed work (10/8-10/11, 01/22/20). She needs forms completed for work related to this and will send via MyChart once she receives them from work    I discussed the assessment and treatment plan with the patient. The patient was provided an opportunity to ask questions and all were answered. The patient agreed with the plan and demonstrated an understanding of the instructions.   The patient was advised to call back or seek an in-person evaluation if the symptoms worsen or if the condition fails to improve as anticipated.   Luana Shu, DO

## 2020-02-12 ENCOUNTER — Encounter: Payer: Self-pay | Admitting: Family Medicine

## 2020-02-26 NOTE — Telephone Encounter (Signed)
Forms completed and on your desk. Please fax and respond to pt. Thanks!

## 2020-03-11 ENCOUNTER — Other Ambulatory Visit: Payer: BC Managed Care – PPO

## 2020-03-11 DIAGNOSIS — Z20822 Contact with and (suspected) exposure to covid-19: Secondary | ICD-10-CM

## 2020-03-12 LAB — NOVEL CORONAVIRUS, NAA: SARS-CoV-2, NAA: NOT DETECTED

## 2020-03-12 LAB — SARS-COV-2, NAA 2 DAY TAT

## 2020-03-24 ENCOUNTER — Other Ambulatory Visit: Payer: Self-pay

## 2020-03-24 MED ORDER — HYDROXYZINE HCL 25 MG PO TABS
25.0000 mg | ORAL_TABLET | Freq: Three times a day (TID) | ORAL | 0 refills | Status: DC
Start: 2020-03-24 — End: 2020-05-17

## 2020-04-15 ENCOUNTER — Telehealth: Payer: Self-pay

## 2020-04-15 NOTE — Telephone Encounter (Signed)
Faxed PA form to [facility] @ [phone].  Waiting on response.  Dm/cma

## 2020-04-26 ENCOUNTER — Telehealth: Payer: Self-pay

## 2020-04-26 NOTE — Telephone Encounter (Signed)
lft VM to rtn call to discuss the fax for refill request for a new mail order company. Dm/cma

## 2020-05-17 ENCOUNTER — Other Ambulatory Visit: Payer: Self-pay

## 2020-05-17 MED ORDER — NORETHIN ACE-ETH ESTRAD-FE 1-20 MG-MCG PO TABS
1.0000 | ORAL_TABLET | Freq: Every day | ORAL | 3 refills | Status: DC
Start: 2020-05-17 — End: 2021-01-25

## 2020-05-17 MED ORDER — HYDROXYZINE HCL 25 MG PO TABS
25.0000 mg | ORAL_TABLET | Freq: Three times a day (TID) | ORAL | 1 refills | Status: DC
Start: 2020-05-17 — End: 2020-10-20

## 2020-05-17 NOTE — Telephone Encounter (Signed)
Received a refill request for the following meds: Hydroxizine 25 mg & Blisovi FE1/20 to the new mail order company.   Please review and advise.  Thanks.  Dm/cma

## 2020-06-01 ENCOUNTER — Other Ambulatory Visit: Payer: Self-pay

## 2020-06-01 MED ORDER — ESOMEPRAZOLE MAGNESIUM 40 MG PO CPDR: 40.0000 mg | DELAYED_RELEASE_CAPSULE | Freq: Every day | ORAL | 0 refills | Status: DC

## 2020-06-04 ENCOUNTER — Encounter: Payer: Self-pay | Admitting: Family Medicine

## 2020-06-04 ENCOUNTER — Other Ambulatory Visit: Payer: Self-pay | Admitting: Family Medicine

## 2020-06-04 DIAGNOSIS — R5382 Chronic fatigue, unspecified: Secondary | ICD-10-CM

## 2020-06-08 MED ORDER — MODAFINIL 100 MG PO TABS: 100.0000 mg | ORAL_TABLET | Freq: Every day | ORAL | 0 refills | Status: DC

## 2020-06-08 MED ORDER — METHYLPHENIDATE HCL 20 MG PO TABS: 20.0000 mg | ORAL_TABLET | Freq: Two times a day (BID) | ORAL | 0 refills | Status: DC

## 2020-06-08 NOTE — Telephone Encounter (Signed)
30 day supply sent. Pt is overdue for 3 mo f/u and will need appt prior to next refill

## 2020-06-08 NOTE — Telephone Encounter (Signed)
LVM for patient to return call. 

## 2020-06-24 MED ORDER — ESOMEPRAZOLE MAGNESIUM 40 MG PO CPDR: 40.0000 mg | DELAYED_RELEASE_CAPSULE | Freq: Every day | ORAL | 0 refills | Status: DC

## 2020-06-24 MED ORDER — FAMOTIDINE 40 MG PO TABS: 40.0000 mg | ORAL_TABLET | Freq: Every day | ORAL | 0 refills | Status: DC

## 2020-06-24 NOTE — Telephone Encounter (Signed)
Last VV 01/27/20 Last fill esomeprazole 06/01/20  #90/0 Last fill Famotidine 05/07/19 #90/3

## 2020-07-10 ENCOUNTER — Other Ambulatory Visit: Payer: Self-pay | Admitting: Family Medicine

## 2020-07-10 DIAGNOSIS — R5382 Chronic fatigue, unspecified: Secondary | ICD-10-CM

## 2020-08-10 ENCOUNTER — Encounter: Payer: Self-pay | Admitting: Family Medicine

## 2020-08-23 ENCOUNTER — Encounter: Payer: Self-pay | Admitting: Family Medicine

## 2020-08-30 ENCOUNTER — Encounter: Payer: Self-pay | Admitting: Family Medicine

## 2020-09-02 ENCOUNTER — Telehealth (INDEPENDENT_AMBULATORY_CARE_PROVIDER_SITE_OTHER): Payer: BC Managed Care – PPO | Admitting: Family Medicine

## 2020-09-02 ENCOUNTER — Encounter: Payer: Self-pay | Admitting: Family Medicine

## 2020-09-02 VITALS — Ht 63.0 in

## 2020-09-02 DIAGNOSIS — J4531 Mild persistent asthma with (acute) exacerbation: Secondary | ICD-10-CM | POA: Diagnosis not present

## 2020-09-02 MED ORDER — PREDNISONE 20 MG PO TABS: 20.0000 mg | ORAL_TABLET | Freq: Two times a day (BID) | ORAL | 0 refills | Status: AC

## 2020-09-02 MED ORDER — BENZONATATE 200 MG PO CAPS: 200.0000 mg | ORAL_CAPSULE | Freq: Two times a day (BID) | ORAL | 0 refills | Status: DC | PRN

## 2020-09-02 NOTE — Progress Notes (Signed)
Established Patient Office Visit  Subjective:  Patient ID: Anna Burke, female    DOB: 02/21/1981  Age: 40 y.o. MRN: [id number]  CC:  Chief Complaint  Patient presents with  . Cough    Positive for covid x 2 weeks ago, still has cough, fatigue and sore throat.     HPI Anna Burke presents for follow-up and treatment of sequelae of a recent COVID infection.  She has a lingering mostly nonproductive cough with tightness in her chest.  There is no frank wheezing.  She has no asthma history.  There is been no fever or chills.  She does not smoke.  Smell and taste have returned but she realizes that her smell has not returned to it's normal state.  There is some ongoing fatigue.  Cognition seems to be intact.  She has had the full COVID series with a booster.  Past Medical History:  Diagnosis Date  . Depression   . Insomnia    Sleep study on 09/03/2009 by Dr. Maple Hudson consistent with insomnia but no organic sleep disturbance identified.  . Interstitial cystitis    Dr. Logan Bores [facility]    History reviewed. No pertinent surgical history.  Family History  Problem Relation Age of Onset  . Arthritis Mother   . Heart disease Mother   . Arthritis Father     Social History   Socioeconomic History  . Marital status: Divorced    Spouse name: Not on file  . Number of children: 0  . Years of education: Not on file  . Highest education level: Not on file  Occupational History  . 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 Smoking status: Never Smoker  . Smokeless tobacco: Never Used  Substance and Sexual Activity  . Alcohol use: Yes  . Drug use: No  . Sexual activity: Not on file  Other Topics Concern  . Not on file  Social History Narrative  . Not on file   Social Determinants of Health   Financial Resource Strain: Not on file  Food Insecurity: Not on file  Transportation Needs: Not on file  Physical Activity: Not on file  Stress: Not on  file  Social Connections: Not on file  Intimate Partner Violence: Not on file    Outpatient Medications Prior to Visit  Medication Sig Dispense Refill  . buPROPion (WELLBUTRIN XL) 300 MG 24 hr tablet Take 1 tablet (300 mg total) by mouth daily. 90 tablet 3  . diazepam (VALIUM) 5 MG tablet TAKE 1 TABLET DAILY AS NEEDED 30 tablet 0  . esomeprazole (NEXIUM) 40 MG capsule TAKE 1 CAPSULE DAILY 90 capsule 0  . famotidine (PEPCID) 40 MG tablet TAKE 1 TABLET DAILY 90 tablet 0  . hydrOXYzine (ATARAX/VISTARIL) 25 MG tablet Take 1 tablet (25 mg total) by mouth 3 (three) times daily. 270 tablet 1  . lisinopril (ZESTRIL) 5 MG tablet TAKE 1 TABLET DAILY 90 tablet 3  . methylphenidate (RITALIN) 20 MG tablet Take 1 tablet (20 mg total) by mouth 2 (two) times daily. 60 tablet 0  . modafinil (PROVIGIL) 100 MG tablet TAKE 1 TABLET DAILY 30 tablet 0  . norethindrone-ethinyl estradiol (BLISOVI FE 1/20) 1-20 MG-MCG tablet Take 1 tablet by mouth daily. 84 tablet 3   No facility-administered medications prior to visit.    Allergies  Allergen Reactions  . Sulfa Antibiotics Hives  . Ortho Evra [Norelgestromin-Eth Estradiol] Rash and Other (See Comments)    Elevated BP  .  Shellfish Allergy Nausea And Vomiting    ROS Review of Systems  Constitutional: Negative for chills, diaphoresis, fatigue, fever and unexpected weight change.  HENT: Positive for postnasal drip and sore throat.   Eyes: Negative for photophobia and visual disturbance.  Respiratory: Positive for cough and chest tightness. Negative for shortness of breath.   Cardiovascular: Negative.   Gastrointestinal: Negative.   Musculoskeletal: Negative for arthralgias and myalgias.  Neurological: Negative for headaches.      Objective:    Physical Exam Vitals and nursing note reviewed.  Constitutional:      General: She is not in acute distress.    Appearance: Normal appearance. She is not ill-appearing or toxic-appearing.  HENT:     Head:  Normocephalic and atraumatic.     Right Ear: External ear normal.     Left Ear: External ear normal.  Eyes:     General:        Right eye: No discharge.        Left eye: No discharge.     Conjunctiva/sclera: Conjunctivae normal.  Pulmonary:     Effort: Pulmonary effort is normal.  Neurological:     Mental Status: She is alert and oriented to person, place, and time.  Psychiatric:        Mood and Affect: Mood normal.        Behavior: Behavior normal.     Ht 5\' 3"  (1.6 m)   BMI 38.97 kg/m  Wt Readings from Last 3 Encounters:  01/27/20 220 lb (99.8 kg)  07/30/19 220 lb (99.8 kg)  03/26/19 232 lb (105.2 kg)     Health Maintenance Due  Topic Date Due  . Hepatitis C Screening  Never done  . PAP SMEAR-Modifier  03/29/2018    There are no preventive care reminders to display for this patient.  Lab Results  Component Value Date   TSH 2.45 01/08/2019   Lab Results  Component Value Date   WBC 8.5 01/08/2019   HGB 13.3 01/08/2019   HCT 40.0 01/08/2019   MCV 90.9 01/08/2019   PLT 390.0 01/08/2019   Lab Results  Component Value Date   NA 137 01/08/2019   K 3.9 01/08/2019   CO2 20 01/08/2019   GLUCOSE 94 01/08/2019   BUN 14 01/08/2019   CREATININE 0.85 01/08/2019   BILITOT 0.5 01/25/2016   ALKPHOS 88 01/25/2016   AST 16 01/25/2016   ALT 15 01/25/2016   PROT 7.7 01/25/2016   ALBUMIN 4.1 01/25/2016   CALCIUM 9.2 01/08/2019   GFR 74.81 01/08/2019   Lab Results  Component Value Date   CHOL 176 03/25/2015   Lab Results  Component Value Date   HDL 56.70 03/25/2015   Lab Results  Component Value Date   LDLCALC 85 03/25/2015   Lab Results  Component Value Date   TRIG 171.0 (H) 03/25/2015   Lab Results  Component Value Date   CHOLHDL 3 03/25/2015   No results found for: HGBA1C    Assessment & Plan:   Problem List Items Addressed This Visit   None   Visit Diagnoses    Mild persistent reactive airway disease with acute exacerbation    -  Primary    Relevant Medications   predniSONE (DELTASONE) 20 MG tablet   benzonatate (TESSALON) 200 MG capsule      Meds ordered this encounter  Medications  . predniSONE (DELTASONE) 20 MG tablet    Sig: Take 1 tablet (20 mg total) by mouth 2 (two) times  daily with a meal for 7 days.    Dispense:  14 tablet    Refill:  0  . benzonatate (TESSALON) 200 MG capsule    Sig: Take 1 capsule (200 mg total) by mouth 2 (two) times daily as needed for cough.    Dispense:  20 capsule    Refill:  0    Follow-up: Return in about 1 week (around 09/09/2020), or if symptoms worsen or fail to improve.    Mliss Sax, MD   Virtual Visit via Video Note  I connected with Anna Burke on 09/02/20 at 11:00 AM EDT by a video enabled telemedicine application and verified that I am speaking with the correct person using two identifiers.  Location: Patient: home alone in her room Provider: work   I discussed the limitations of evaluation and management by telemedicine and the availability of in person appointments. The patient expressed understanding and agreed to proceed.  History of Present Illness:    Observations/Objective:   Assessment and Plan:   Follow Up Instructions:    I discussed the assessment and treatment plan with the patient. The patient was provided an opportunity to ask questions and all were answered. The patient agreed with the plan and demonstrated an understanding of the instructions.   The patient was advised to call back or seek an in-person evaluation if the symptoms worsen or if the condition fails to improve as anticipated.  I provided 25 minutes of non-face-to-face time during this encounter.   Mliss Sax, MD

## 2020-09-09 MED ORDER — HYDROCOD POLST-CPM POLST ER 10-8 MG/5ML PO SUER
5.0000 mL | Freq: Two times a day (BID) | ORAL | 0 refills | Status: DC | PRN
Start: 2020-09-09 — End: 2021-01-04

## 2020-09-12 ENCOUNTER — Other Ambulatory Visit: Payer: Self-pay | Admitting: Family Medicine

## 2020-09-12 DIAGNOSIS — R5382 Chronic fatigue, unspecified: Secondary | ICD-10-CM

## 2020-10-06 ENCOUNTER — Other Ambulatory Visit: Payer: Self-pay | Admitting: Family Medicine

## 2020-10-06 DIAGNOSIS — R5382 Chronic fatigue, unspecified: Secondary | ICD-10-CM

## 2020-10-20 ENCOUNTER — Other Ambulatory Visit: Payer: Self-pay | Admitting: Family Medicine

## 2020-10-20 NOTE — Telephone Encounter (Signed)
Chart supports Rx  Last OV 09/02/2020 No future appts scheduled.

## 2020-10-21 ENCOUNTER — Encounter: Payer: Self-pay | Admitting: Family Medicine

## 2020-11-16 ENCOUNTER — Telehealth: Payer: Self-pay

## 2020-11-16 NOTE — Telephone Encounter (Signed)
Received a refill request for:  Wellbutrin XL 300 mg tabs LR 01/01/20, #90, 3 rf LoV 01/28/20 FOV  none scheduled.  Tried to call cell number to advise that she needed an appointment to get refill with one of our other providers but no VM set up.   Will try later. Dm/cma

## 2020-12-21 NOTE — Telephone Encounter (Signed)
Pt is returning your call

## 2020-12-22 NOTE — Telephone Encounter (Signed)
I called pt and left her a voice message that she needs an appointment to get this script.

## 2020-12-27 NOTE — Telephone Encounter (Signed)
Lft VM to rtn call to schedule an appointment.  Dm/cma  

## 2020-12-30 NOTE — Telephone Encounter (Signed)
Lft VM to rtn call to schedule an appointment.  Dm/cma  

## 2021-01-04 ENCOUNTER — Other Ambulatory Visit: Payer: Self-pay

## 2021-01-04 ENCOUNTER — Ambulatory Visit (INDEPENDENT_AMBULATORY_CARE_PROVIDER_SITE_OTHER): Payer: BC Managed Care – PPO | Admitting: Nurse Practitioner

## 2021-01-04 ENCOUNTER — Encounter: Payer: Self-pay | Admitting: Nurse Practitioner

## 2021-01-04 VITALS — BP 131/83 | HR 92 | Temp 99.2°F | Ht 63.0 in | Wt 228.0 lb

## 2021-01-04 DIAGNOSIS — Z7689 Persons encountering health services in other specified circumstances: Secondary | ICD-10-CM | POA: Diagnosis not present

## 2021-01-04 DIAGNOSIS — N3010 Interstitial cystitis (chronic) without hematuria: Secondary | ICD-10-CM

## 2021-01-04 DIAGNOSIS — J4531 Mild persistent asthma with (acute) exacerbation: Secondary | ICD-10-CM

## 2021-01-04 DIAGNOSIS — R030 Elevated blood-pressure reading, without diagnosis of hypertension: Secondary | ICD-10-CM | POA: Diagnosis not present

## 2021-01-04 DIAGNOSIS — R0600 Dyspnea, unspecified: Secondary | ICD-10-CM | POA: Diagnosis not present

## 2021-01-04 DIAGNOSIS — R0609 Other forms of dyspnea: Secondary | ICD-10-CM

## 2021-01-04 MED ORDER — LISINOPRIL 5 MG PO TABS: 5.0000 mg | ORAL_TABLET | Freq: Every day | ORAL | 1 refills | Status: DC

## 2021-01-04 MED ORDER — ALBUTEROL SULFATE HFA 108 (90 BASE) MCG/ACT IN AERS: 2.0000 | INHALATION_SPRAY | Freq: Four times a day (QID) | RESPIRATORY_TRACT | 2 refills | Status: DC | PRN

## 2021-01-04 MED ORDER — MONTELUKAST SODIUM 10 MG PO TABS: 10.0000 mg | ORAL_TABLET | Freq: Every day | ORAL | 1 refills | Status: DC

## 2021-01-04 MED ORDER — LISINOPRIL 5 MG PO TABS
5.0000 mg | ORAL_TABLET | Freq: Every day | ORAL | 1 refills | Status: DC
Start: 2021-01-04 — End: 2021-06-29
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
## 2021-01-15 DIAGNOSIS — R0609 Other forms of dyspnea: Secondary | ICD-10-CM | POA: Insufficient documentation

## 2021-01-15 DIAGNOSIS — Z7689 Persons encountering health services in other specified circumstances: Secondary | ICD-10-CM | POA: Insufficient documentation

## 2021-01-15 DIAGNOSIS — J45909 Unspecified asthma, uncomplicated: Secondary | ICD-10-CM | POA: Insufficient documentation

## 2021-01-18 ENCOUNTER — Emergency Department (HOSPITAL_COMMUNITY)
Admission: EM | Admit: 2021-01-18 | Discharge: 2021-01-19 | Disposition: A | Discharge status: Home / Self Care | Payer: BC Managed Care – PPO | Attending: Emergency Medicine | Admitting: Emergency Medicine

## 2021-01-18 ENCOUNTER — Other Ambulatory Visit: Payer: Self-pay

## 2021-01-18 ENCOUNTER — Emergency Department (HOSPITAL_COMMUNITY): Payer: BC Managed Care – PPO

## 2021-01-18 ENCOUNTER — Encounter (HOSPITAL_COMMUNITY): Payer: Self-pay

## 2021-01-18 DIAGNOSIS — J45909 Unspecified asthma, uncomplicated: Secondary | ICD-10-CM | POA: Insufficient documentation

## 2021-01-18 DIAGNOSIS — I10 Essential (primary) hypertension: Secondary | ICD-10-CM | POA: Insufficient documentation

## 2021-01-18 DIAGNOSIS — R079 Chest pain, unspecified: Secondary | ICD-10-CM | POA: Insufficient documentation

## 2021-01-18 DIAGNOSIS — Z79899 Other long term (current) drug therapy: Secondary | ICD-10-CM | POA: Diagnosis not present

## 2021-01-18 DIAGNOSIS — R202 Paresthesia of skin: Secondary | ICD-10-CM | POA: Insufficient documentation

## 2021-01-18 LAB — CBC
HCT: 40.4 % (ref 36.0–46.0)
Hemoglobin: 13.7 g/dL (ref 12.0–15.0)
MCH: 29.8 pg (ref 26.0–34.0)
MCHC: 33.9 g/dL (ref 30.0–36.0)
MCV: 87.8 fL (ref 80.0–100.0)
Platelets: 417 10*3/uL — ABNORMAL HIGH (ref 150–400)
RBC: 4.6 MIL/uL (ref 3.87–5.11)
RDW: 13.4 % (ref 11.5–15.5)
WBC: 9.1 10*3/uL (ref 4.0–10.5)
nRBC: 0 % (ref 0.0–0.2)

## 2021-01-18 LAB — I-STAT BETA HCG BLOOD, ED (MC, WL, AP ONLY): I-stat hCG, quantitative: <5 m[IU]/mL (ref <5)

## 2021-01-18 LAB — BASIC METABOLIC PANEL
Anion gap: 11 (ref 5–15)
BUN: 10 mg/dL (ref 6–20)
CO2: 21 mmol/L — ABNORMAL LOW (ref 22–32)
Calcium: 9.6 mg/dL (ref 8.9–10.3)
Chloride: 104 mmol/L (ref 98–111)
Creatinine, Ser: 0.8 mg/dL (ref 0.44–1.00)
GFR, Estimated: >60 mL/min (ref >60)
Glucose, Bld: 110 mg/dL — ABNORMAL HIGH (ref 70–99)
Potassium: 3.6 mmol/L (ref 3.5–5.1)
Sodium: 136 mmol/L (ref 135–145)

## 2021-01-18 LAB — TROPONIN I (HIGH SENSITIVITY): Troponin I (High Sensitivity): <2 ng/L (ref <18)

## 2021-01-18 NOTE — ED Triage Notes (Signed)
Pt reports chest pain that radiates to her right shoulder that began today. Pt reports hx of acid reflux.

## 2021-01-19 LAB — TROPONIN I (HIGH SENSITIVITY): Troponin I (High Sensitivity): <2 ng/L (ref <18)

## 2021-01-19 LAB — D-DIMER, QUANTITATIVE: D-Dimer, Quant: <0.27 ug/mL-FEU (ref 0.00–0.50)

## 2021-01-19 NOTE — ED Provider Notes (Signed)
[facility] [place]-EMERGENCY DEPT Provider Note   CSN: [id number] Arrival date & time: 01/18/21  2134     History Chief Complaint  Patient presents with   Chest Pain    Anna Burke is a 40 y.o. female.  Patient presents to the emergency department with a chief complaint of chest pain.  She states that the pain radiates up to her left shoulder.  Symptoms began earlier today.  She states that she does have a history of acid reflux, but states that when the pain began to radiate up into her shoulder it made her nervous.  She states that she had some tingling in bilateral hands and around her mouth.  She denies any fever, chills, or cough.  Denies any other associated symptoms.  Denies any treatment prior to arrival.  The history is provided by the patient. No language interpreter was used.      Past Medical History:  Diagnosis Date   Depression    Insomnia    Sleep study on 09/03/2009 by Dr. Maple Hudson consistent with insomnia but no organic sleep disturbance identified.   Interstitial cystitis    Dr. Logan Bores [facility]    Patient Active Problem List   Diagnosis Date Noted   Encounter to establish care 01/15/2021   Dyspnea on exertion 01/15/2021   Reactive airway disease 01/15/2021   Rib pain on left side 04/22/2019   Cough variant asthma 04/14/2019   Chronic cough 03/26/2019   Allergy to mold 02/10/2019   Hair thinning 01/06/2019   GERD (gastroesophageal reflux disease) 01/25/2016   Elevated blood-pressure reading without diagnosis of hypertension 01/25/2016   Family history of premature CAD 12/23/2013   Unspecified constipation 04/19/2013   Chronic fatigue 01/06/2013   Insomnia 01/24/2011   Depression    Interstitial cystitis     History reviewed. No pertinent surgical history.   OB History   No obstetric history on file.     Family History  Problem Relation Age of Onset   Stroke Mother    Arthritis Mother    Heart disease Mother    Heart attack  Mother    Heart attack Father    Arthritis Father    High blood pressure Father     Social History   Tobacco Use   Smoking status: Never   Smokeless tobacco: Never  Substance Use Topics   Alcohol use: Yes   Drug use: No    Home Medications Prior to Admission medications   Medication Sig Start Date End Date Taking? Authorizing Provider  albuterol (VENTOLIN HFA) 108 (90 Base) MCG/ACT inhaler Inhale 2 puffs into the lungs every 6 (six) hours as needed for wheezing or shortness of breath. 01/04/21   Carlean Jews, NP  buPROPion (WELLBUTRIN XL) 300 MG 24 hr tablet Take 1 tablet (300 mg total) by mouth daily. 01/01/20   Cirigliano, Jearld Lesch, DO  diazepam (VALIUM) 5 MG tablet TAKE 1 TABLET DAILY AS NEEDED 11/27/19   Cirigliano, Corrie Dandy K, DO  esomeprazole (NEXIUM) 40 MG capsule TAKE 1 CAPSULE DAILY 07/11/20   Luana Shu K, DO  famotidine (PEPCID) 40 MG tablet TAKE 1 TABLET DAILY 07/11/20   Cirigliano, Corrie Dandy K, DO  hydrOXYzine (ATARAX/VISTARIL) 25 MG tablet TAKE 1 TABLET 3 TIMES A DAY 10/20/20   Cirigliano, Mary K, DO  lisinopril (ZESTRIL) 5 MG tablet Take 1 tablet (5 mg total) by mouth daily. 01/04/21   Carlean Jews, NP  methylphenidate (RITALIN) 20 MG tablet Take 1 tablet (20 mg  total) by mouth 2 (two) times daily. 06/08/20   Cirigliano, Jearld Lesch, DO  modafinil (PROVIGIL) 100 MG tablet TAKE 1 TABLET DAILY 10/06/20   Cirigliano, Mary K, DO  montelukast (SINGULAIR) 10 MG tablet Take 1 tablet (10 mg total) by mouth at bedtime. 01/04/21   Carlean Jews, NP  norethindrone-ethinyl estradiol (BLISOVI FE 1/20) 1-20 MG-MCG tablet Take 1 tablet by mouth daily. 05/17/20   Cirigliano, Jearld Lesch, DO    Allergies    Sulfa antibiotics, Ortho evra [norelgestromin-eth estradiol], and Shellfish allergy  Review of Systems   Review of Systems  All other systems reviewed and are negative.  Physical Exam Updated Vital Signs BP (!) 146/93   Pulse 91   Temp 98.5 F (36.9 C) (Oral)   Resp 18   Ht 5\' 4"   (1.626 m)   Wt 102.1 kg   SpO2 100%   BMI 38.62 kg/m   Physical Exam Vitals and nursing note reviewed.  Constitutional:      General: She is not in acute distress.    Appearance: She is well-developed.  HENT:     Head: Normocephalic and atraumatic.  Eyes:     Conjunctiva/sclera: Conjunctivae normal.  Cardiovascular:     Rate and Rhythm: Normal rate and regular rhythm.     Heart sounds: No murmur heard. Pulmonary:     Effort: Pulmonary effort is normal. No respiratory distress.     Breath sounds: Normal breath sounds.  Abdominal:     Palpations: Abdomen is soft.     Tenderness: There is no abdominal tenderness.  Musculoskeletal:        General: Normal range of motion.     Cervical back: Neck supple.  Skin:    General: Skin is warm and dry.  Neurological:     Mental Status: She is alert and oriented to person, place, and time.  Psychiatric:        Mood and Affect: Mood normal.        Behavior: Behavior normal.    ED Results / Procedures / Treatments   Labs (all labs ordered are listed, but only abnormal results are displayed) Labs Reviewed  BASIC METABOLIC PANEL - Abnormal; Notable for the following components:      Result Value   CO2 21 (*)    Glucose, Bld 110 (*)    All other components within normal limits  CBC - Abnormal; Notable for the following components:   Platelets 417 (*)    All other components within normal limits  D-DIMER, QUANTITATIVE  I-STAT BETA HCG BLOOD, ED ([facility], [facility], [facility] ONLY)  TROPONIN I (HIGH SENSITIVITY)  TROPONIN I (HIGH SENSITIVITY)    EKG EKG Interpretation  Date/Time:  Thursday January 19 2021 01:39:48 EDT Ventricular Rate:  98 PR Interval:  125 QRS Duration: 100 QT Interval:  363 QTC Calculation: 464 R Axis:   71 Text Interpretation: Sinus rhythm Normal ECG When compared with ECG of 01/18/2021, No significant change was found Confirmed by 03/20/2021 (Dione Booze) on 01/19/2021 1:49:34 AM  Radiology DG Chest 2 View  Result Date:  01/18/2021 CLINICAL DATA:  Chest pain radiating to the right shoulder. EXAM: CHEST - 2 VIEW COMPARISON:  04/22/2019 FINDINGS: The heart size and mediastinal contours are within normal limits. Both lungs are clear. The visualized skeletal structures are unremarkable. IMPRESSION: No active cardiopulmonary disease. Electronically Signed   By: 04/24/2019 M.D.   On: 01/18/2021 22:13    Procedures Procedures   Medications Ordered in ED Medications -  No data to display  ED Course  I have reviewed the triage vital signs and the nursing notes.  Pertinent labs & imaging results that were available during my care of the patient were reviewed by me and considered in my medical decision making (see chart for details).    MDM Rules/Calculators/A&P                           Patient presents with chest pain.  DDx includes ACS, PE, pneumothorax, aortic dissection, esophageal rupture, pericarditis, chest wall pain.  Doubt ACS, normal troponin, no ischemic EKG findings, HEART score is: 1.  Low risk for PE, d-dimer is negative.  No evidence of pneumothorax on CXR.  Doubt dissection, no mediastinal widening on CXR, no ripping/tearing chest pain, neurovascularly intact.  Doubt pericarditis, no positional changes, or diffuse ST elevations on EKG.  Pain is not reproducible, doubt MSK.  Delta troponin is negative.   Do not feel that further emergent workup is indicated at this time.  Question stress or anxiety.  Will recommend PCP follow-up.  All findings were discussed with patient.  Patient understands and agrees with the plan.    Final Clinical Impression(s) / ED Diagnoses Final diagnoses:  Nonspecific chest pain    Rx / DC Orders ED Discharge Orders     None        Roxy Horseman, PA-C 01/19/21 0459    Dione Booze, MD 01/19/21 [phone]

## 2021-01-19 NOTE — Discharge Instructions (Addendum)
It's unclear what the cause of your chest pain symptoms are tonight.  Your blood work is reassuring.  Your chest x-ray shows no evidence of infection.  Your heart markers and EKG do not show any concerning signs.  I recommend that you follow-up with your regular doctor.  Continue taking your acid reflux medicine.

## 2021-01-25 ENCOUNTER — Other Ambulatory Visit: Payer: Self-pay

## 2021-01-25 ENCOUNTER — Ambulatory Visit (INDEPENDENT_AMBULATORY_CARE_PROVIDER_SITE_OTHER): Payer: BC Managed Care – PPO | Admitting: Nurse Practitioner

## 2021-01-25 ENCOUNTER — Encounter: Payer: Self-pay | Admitting: Nurse Practitioner

## 2021-01-25 VITALS — BP 121/81 | HR 87 | Temp 98.6°F | Ht 64.0 in | Wt 232.9 lb

## 2021-01-25 DIAGNOSIS — J4531 Mild persistent asthma with (acute) exacerbation: Secondary | ICD-10-CM

## 2021-01-25 DIAGNOSIS — F419 Anxiety disorder, unspecified: Secondary | ICD-10-CM

## 2021-01-25 DIAGNOSIS — Z8249 Family history of ischemic heart disease and other diseases of the circulatory system: Secondary | ICD-10-CM | POA: Diagnosis not present

## 2021-01-25 DIAGNOSIS — Z3041 Encounter for surveillance of contraceptive pills: Secondary | ICD-10-CM

## 2021-01-25 DIAGNOSIS — R5382 Chronic fatigue, unspecified: Secondary | ICD-10-CM

## 2021-01-25 DIAGNOSIS — R079 Chest pain, unspecified: Secondary | ICD-10-CM | POA: Diagnosis not present

## 2021-01-25 DIAGNOSIS — G4726 Circadian rhythm sleep disorder, shift work type: Secondary | ICD-10-CM

## 2021-01-25 DIAGNOSIS — K219 Gastro-esophageal reflux disease without esophagitis: Secondary | ICD-10-CM

## 2021-01-25 DIAGNOSIS — Z0001 Encounter for general adult medical examination with abnormal findings: Secondary | ICD-10-CM

## 2021-01-25 MED ORDER — FAMOTIDINE 40 MG PO TABS: 40.0000 mg | ORAL_TABLET | Freq: Every day | ORAL | 1 refills | Status: DC

## 2021-01-25 MED ORDER — ALBUTEROL SULFATE HFA 108 (90 BASE) MCG/ACT IN AERS: 2.0000 | INHALATION_SPRAY | Freq: Four times a day (QID) | RESPIRATORY_TRACT | 3 refills | Status: DC | PRN

## 2021-01-25 MED ORDER — BUPROPION HCL ER (XL) 300 MG PO TB24: 300.0000 mg | ORAL_TABLET | Freq: Every day | ORAL | 3 refills | Status: DC

## 2021-01-25 MED ORDER — HYDROXYZINE HCL 25 MG PO TABS: 25.0000 mg | ORAL_TABLET | Freq: Three times a day (TID) | ORAL | 1 refills | Status: DC | PRN

## 2021-01-25 MED ORDER — NORETHIN ACE-ETH ESTRAD-FE 1-20 MG-MCG PO TABS: 1.0000 | ORAL_TABLET | Freq: Every day | ORAL | 3 refills | Status: DC

## 2021-01-25 MED ORDER — MODAFINIL 100 MG PO TABS: 100.0000 mg | ORAL_TABLET | Freq: Every day | ORAL | 0 refills | Status: DC

## 2021-01-25 MED ORDER — ESOMEPRAZOLE MAGNESIUM 40 MG PO CPDR: 40.0000 mg | DELAYED_RELEASE_CAPSULE | Freq: Every day | ORAL | 1 refills | Status: DC

## 2021-01-25 MED ORDER — DIAZEPAM 5 MG PO TABS: ORAL_TABLET | ORAL | 0 refills | Status: DC

## 2021-01-25 NOTE — Progress Notes (Signed)
Established Patient Office Visit  Subjective:  Patient ID: Anna Burke, female    DOB: [birth year]  Age: 40 y.o. MRN: [id number]  CC:  Chief Complaint  Patient presents with   Annual Exam    HPI Anna Burke presents for annual wellness visit. She did have episode of nonspecific chest pain labs done at time of ER visit were ok. They did ECG which showed normal sinus rhythm with no changes since her previous ECG. A chest x-ray was done which was negative for evidence of acute cardiopulmonary disease. She states that she continues to have intermittent episodes of mild chest discomfort. She is anxious about this as her mother did have severe and undiagnosed coronary artery disease.  She takes modafanil for chronic fatigue and shift work disorder. 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 She knows she will need to have this more often once new hours start.  She is due to have routine, fasting labs.  She is due to have a Pap smear part of CPE.  Is choosing to hold off on that today as she just darted menstrual period.  Past Medical History:  Diagnosis Date   Depression    Insomnia    Sleep study on 09/03/2009 by Dr. Maple Hudson consistent with insomnia but no organic sleep disturbance identified.   Interstitial cystitis    Dr. Logan Bores [facility]    History reviewed. No pertinent surgical history.  Family History  Problem Relation Age of Onset   Stroke Mother    Arthritis Mother    Heart disease Mother    Heart attack Mother    Heart attack Father    Arthritis Father    High blood pressure Father     Social History   Socioeconomic History   Marital status: Divorced    Spouse name: Not on file   Number of children: 0   Years of education: Not on file   Highest education level: Not on file  Occupational History   Occupation: umemployed    Employer: NOT EMPLOYED  Tobacco Use   Smoking  status: Never   Smokeless tobacco: Never  Substance and Sexual Activity   Alcohol use: Yes   Drug use: No   Sexual activity: Yes  Other Topics Concern   Not on file  Social History Narrative   Not on file   Social Determinants of Health   Financial Resource Strain: Not on file  Food Insecurity: Not on file  Transportation Needs: Not on file  Physical Activity: Not on file  Stress: Not on file  Social Connections: Not on file  Intimate Partner Violence: Not on file    Outpatient Medications Prior to Visit  Medication Sig Dispense Refill   lisinopril (ZESTRIL) 5 MG tablet Take 1 tablet (5 mg total) by mouth daily. 90 tablet 1   montelukast (SINGULAIR) 10 MG tablet Take 1 tablet (10 mg total) by mouth at bedtime. 90 tablet 1   albuterol (VENTOLIN HFA) 108 (90 Base) MCG/ACT inhaler Inhale 2 puffs into the lungs every 6 (six) hours as needed for wheezing or shortness of breath. 1 each 2   buPROPion (WELLBUTRIN XL) 300 MG 24 hr tablet Take 1 tablet (300 mg total) by mouth daily. 90 tablet 3   diazepam (VALIUM) 5 MG tablet TAKE 1 TABLET DAILY AS NEEDED 30 tablet 0   esomeprazole (NEXIUM) 40 MG capsule TAKE 1  CAPSULE DAILY 90 capsule 0   famotidine (PEPCID) 40 MG tablet TAKE 1 TABLET DAILY 90 tablet 0   hydrOXYzine (ATARAX/VISTARIL) 25 MG tablet TAKE 1 TABLET 3 TIMES A DAY 270 tablet 1   methylphenidate (RITALIN) 20 MG tablet Take 1 tablet (20 mg total) by mouth 2 (two) times daily. 60 tablet 0   modafinil (PROVIGIL) 100 MG tablet TAKE 1 TABLET DAILY 30 tablet 0   norethindrone-ethinyl estradiol (BLISOVI FE 1/20) 1-20 MG-MCG tablet Take 1 tablet by mouth daily. 84 tablet 3   No facility-administered medications prior to visit.    Allergies  Allergen Reactions   Sulfa Antibiotics Hives   Ortho Evra [Norelgestromin-Eth Estradiol] Rash and Other (See Comments)    Elevated BP   Shellfish Allergy Nausea And Vomiting    ROS Review of Systems  Constitutional:  Negative for activity  change, appetite change, chills, fatigue and fever.  HENT:  Negative for congestion, postnasal drip, rhinorrhea, sinus pressure, sinus pain, sneezing and sore throat.   Eyes: Negative.   Respiratory:  Negative for cough, chest tightness, shortness of breath and wheezing.   Cardiovascular:  Negative for chest pain and palpitations.       Patient having intermittent episodes of nonspecific chest pain.  Was evaluated in ER on 01/18/2021.  EKG was normal sinus rhythm without any changes from prior EKGs.  Chest x-ray was negative for acute cardiopulmonary disease.  Gastrointestinal:  Negative for abdominal pain, constipation, diarrhea, nausea and vomiting.  Endocrine: Negative for cold intolerance, heat intolerance, polydipsia and polyuria.  Genitourinary:  Negative for dyspareunia, dysuria, flank pain, frequency and urgency.  Musculoskeletal:  Positive for arthralgias and joint swelling. Negative for back pain and myalgias.  Skin:  Negative for rash.  Allergic/Immunologic: Negative for environmental allergies.  Neurological:  Negative for dizziness, weakness and headaches.  Hematological:  Negative for adenopathy.  Psychiatric/Behavioral:  Positive for decreased concentration and sleep disturbance. The patient is nervous/anxious.        Patient does take modafinil when needed due to shiftwork disorder.  Has used this rarely in recent months.  States her schedule is about to change to night shift.  Will need to take it more often at that point.     Objective:    Physical Exam Vitals and nursing note reviewed.  Constitutional:      Appearance: Normal appearance. She is well-developed. She is obese.  HENT:     Head: Normocephalic and atraumatic.     Right Ear: Tympanic membrane, ear canal and external ear normal.     Left Ear: Tympanic membrane, ear canal and external ear normal.     Nose: Nose normal.     Mouth/Throat:     Mouth: Mucous membranes are moist.  Eyes:     Extraocular Movements:  Extraocular movements intact.     Conjunctiva/sclera: Conjunctivae normal.     Pupils: Pupils are equal, round, and reactive to light.  Cardiovascular:     Rate and Rhythm: Normal rate and regular rhythm.     Pulses: Normal pulses.     Heart sounds: Normal heart sounds.  Pulmonary:     Effort: Pulmonary effort is normal.     Breath sounds: Normal breath sounds.  Abdominal:     General: Bowel sounds are normal. There is no distension.     Palpations: Abdomen is soft. There is no mass.     Tenderness: There is no abdominal tenderness. There is no guarding or rebound.     Hernia:  No hernia is present.  Musculoskeletal:        General: Normal range of motion.     Cervical back: Normal range of motion and neck supple.  Lymphadenopathy:     Cervical: No cervical adenopathy.  Skin:    General: Skin is warm and dry.     Capillary Refill: Capillary refill takes less than 2 seconds.  Neurological:     General: No focal deficit present.     Mental Status: She is alert and oriented to person, place, and time.     Cranial Nerves: No cranial nerve deficit.     Sensory: No sensory deficit.     Motor: No weakness.     Coordination: Coordination normal.  Psychiatric:        Mood and Affect: Mood normal.        Behavior: Behavior normal.        Thought Content: Thought content normal.        Judgment: Judgment normal.    Today's Vitals   01/25/21 1406  BP: 121/81  Pulse: 87  Temp: 98.6 F (37 C)  SpO2: 98%  Weight: 232 lb 14.4 oz (105.6 kg)  Height: 5\' 4"  (1.626 m)   Body mass index is 39.98 kg/m.   Wt Readings from Last 3 Encounters:  01/25/21 232 lb 14.4 oz (105.6 kg)  01/18/21 225 lb (102.1 kg)  01/04/21 228 lb (103.4 kg)     Health Maintenance Due  Topic Date Due   Pneumococcal Vaccine 74-65 Years old (1 - PCV) Never done   PAP SMEAR-Modifier  03/29/2018   COVID-19 Vaccine (4 - Booster) 07/14/2020    There are no preventive care reminders to display for this  patient.  Lab Results  Component Value Date   TSH 2.45 01/08/2019   Lab Results  Component Value Date   WBC 9.1 01/18/2021   HGB 13.7 01/18/2021   HCT 40.4 01/18/2021   MCV 87.8 01/18/2021   PLT 417 (H) 01/18/2021   Lab Results  Component Value Date   NA 136 01/18/2021   K 3.6 01/18/2021   CO2 21 (L) 01/18/2021   GLUCOSE 110 (H) 01/18/2021   BUN 10 01/18/2021   CREATININE 0.80 01/18/2021   BILITOT 0.5 01/25/2016   ALKPHOS 88 01/25/2016   AST 16 01/25/2016   ALT 15 01/25/2016   PROT 7.7 01/25/2016   ALBUMIN 4.1 01/25/2016   CALCIUM 9.6 01/18/2021   ANIONGAP 11 01/18/2021   GFR 74.81 01/08/2019   Lab Results  Component Value Date   CHOL 176 03/25/2015   Lab Results  Component Value Date   HDL 56.70 03/25/2015   Lab Results  Component Value Date   LDLCALC 85 03/25/2015   Lab Results  Component Value Date   TRIG 171.0 (H) 03/25/2015   Lab Results  Component Value Date   CHOLHDL 3 03/25/2015   No results found for: HGBA1C    Assessment & Plan:  1. Encounter for general adult medical examination with abnormal findings Annual health maintenance exam today.  2. Chest pain, unspecified type Patient with intermittent nonspecific chest pain.  Was evaluated in ER recently pain.  EKG did show normal sinus rhythm with no changes from prior EKGs.  Based on family history of early coronary artery disease, will refer to cardiology for further evaluation. - Ambulatory referral to Cardiology  3. Family history of early CAD Patient with intermittent nonspecific chest pain.  Was evaluated in ER recently pain.  EKG did show  normal sinus rhythm with no changes from prior EKGs.  Based on family history of early coronary artery disease, will refer to cardiology for further evaluation. - Ambulatory referral to Cardiology  4. Mild persistent reactive airway disease with acute exacerbation May use albuterol rescue inhaler as needed and as prescribed. - albuterol (VENTOLIN  HFA) 108 (90 Base) MCG/ACT inhaler; Inhale 2 puffs into the lungs every 6 (six) hours as needed for wheezing or shortness of breath.  Dispense: 3 each; Refill: 3  5. Chronic fatigue Patient with chronic fatigue likely due to for disorder.  New prescription for modafinil 100 mg tablets sent to mail pharmacy today. - modafinil (PROVIGIL) 100 MG tablet; Take 1 tablet (100 mg total) by mouth daily.  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  New prescription for modafinil 100 mg tablets sent to mail pharmacy today. - modafinil (PROVIGIL) 100 MG tablet; Take 1 tablet (100 mg total) by mouth daily.  Dispense: 30 tablet; Refill: 0  7. Anxiety Patient should continue bupropion XL 300 mg tablets daily.  Hydroxyzine may be taken 3 times a day if needed for anxiety.  Renew current prescription for diazepam 5 mg tablets which is once daily only if needed.  Single 30-day prescription sent to pharmacy. - buPROPion (WELLBUTRIN XL) 300 MG 24 hr tablet; Take 1 tablet (300 mg total) by mouth daily.  Dispense: 90 tablet; Refill: 3 - diazepam (VALIUM) 5 MG tablet; TAKE 1 TABLET DAILY AS NEEDED  Dispense: 30 tablet; Refill: 0 - hydrOXYzine (ATARAX/VISTARIL) 25 MG tablet; Take 1 tablet (25 mg total) by mouth every 8 (eight) hours as needed.  Dispense: 270 tablet; Refill: 1  8. Gastroesophageal reflux disease without esophagitis Continue Nexium 40 mg capsules daily.  May take Pepcid 40 mg daily as needed. - esomeprazole (NEXIUM) 40 MG capsule; Take 1 capsule (40 mg total) by mouth daily.  Dispense: 90 capsule; Refill: 1 - famotidine (PEPCID) 40 MG tablet; Take 1 tablet (40 mg total) by mouth daily.  Dispense: 90 tablet; Refill: 1  9. Encounter for surveillance of contraceptive pills Continue oral contraceptive pills as prescribed.  New prescription sent to mail pharmacy today. - norethindrone-ethinyl estradiol-FE (BLISOVI FE 1/20) 1-20  MG-MCG tablet; Take 1 tablet by mouth daily.  Dispense: 84 tablet; Refill: 3    Problem List Items Addressed This Visit       Respiratory   Reactive airway disease   Relevant Medications   albuterol (VENTOLIN HFA) 108 (90 Base) MCG/ACT inhaler     Digestive   GERD (gastroesophageal reflux disease)   Relevant Medications   esomeprazole (NEXIUM) 40 MG capsule   famotidine (PEPCID) 40 MG tablet     Other   Chronic fatigue   Relevant Medications   modafinil (PROVIGIL) 100 MG tablet   Family history of early CAD   Relevant Orders   Ambulatory referral to Cardiology   Chest pain   Relevant Orders   Ambulatory referral to Cardiology   Shift work sleep disorder   Relevant Medications   modafinil (PROVIGIL) 100 MG tablet   Anxiety   Relevant Medications   buPROPion (WELLBUTRIN XL) 300 MG 24 hr tablet   diazepam (VALIUM) 5 MG tablet   hydrOXYzine (ATARAX/VISTARIL) 25 MG tablet   Encounter for surveillance of contraceptive pills   Relevant Medications   norethindrone-ethinyl estradiol-FE (BLISOVI FE 1/20) 1-20 MG-MCG tablet   Other Visit Diagnoses     Encounter  for general adult medical examination with abnormal findings    -  Primary       Meds ordered this encounter  Medications   albuterol (VENTOLIN HFA) 108 (90 Base) MCG/ACT inhaler    Sig: Inhale 2 puffs into the lungs every 6 (six) hours as needed for wheezing or shortness of breath.    Dispense:  3 each    Refill:  3    Order Specific Question:   Supervising Provider    Answer:   Nani Gasser D [[id number]]   buPROPion (WELLBUTRIN XL) 300 MG 24 hr tablet    Sig: Take 1 tablet (300 mg total) by mouth daily.    Dispense:  90 tablet    Refill:  3    Order Specific Question:   Supervising Provider    Answer:   Nani Gasser D [[id number]]   diazepam (VALIUM) 5 MG tablet    Sig: TAKE 1 TABLET DAILY AS NEEDED    Dispense:  30 tablet    Refill:  0    Order Specific Question:   Supervising Provider    Answer:    Nani Gasser D [[id number]]   esomeprazole (NEXIUM) 40 MG capsule    Sig: Take 1 capsule (40 mg total) by mouth daily.    Dispense:  90 capsule    Refill:  1    Order Specific Question:   Supervising Provider    Answer:   Nani Gasser D [[id number]]   famotidine (PEPCID) 40 MG tablet    Sig: Take 1 tablet (40 mg total) by mouth daily.    Dispense:  90 tablet    Refill:  1    Order Specific Question:   Supervising Provider    Answer:   Nani Gasser D [[id number]]   hydrOXYzine (ATARAX/VISTARIL) 25 MG tablet    Sig: Take 1 tablet (25 mg total) by mouth every 8 (eight) hours as needed.    Dispense:  270 tablet    Refill:  1    Order Specific Question:   Supervising Provider    Answer:   Nani Gasser D [[id number]]   modafinil (PROVIGIL) 100 MG tablet    Sig: Take 1 tablet (100 mg total) by mouth daily.    Dispense:  30 tablet    Refill:  0    Order Specific Question:   Supervising Provider    Answer:   Nani Gasser D [[id number]]   norethindrone-ethinyl estradiol-FE (BLISOVI FE 1/20) 1-20 MG-MCG tablet    Sig: Take 1 tablet by mouth daily.    Dispense:  84 tablet    Refill:  3    Order Specific Question:   Supervising Provider    Answer:   Nani Gasser D [[id number]]    Follow-up: Return in about 3 months (around 04/27/2021) for blood pressure - see below, with pap.    Carlean Jews, NP  This note was dictated using Conservation officer, historic buildings. Rapid proofreading was performed to expedite the delivery of the information. Despite proofreading, phonetic errors will occur which are common with this voice recognition software. Please take this into consideration. If there are any concerns, please contact our office.

## 2021-01-26 ENCOUNTER — Other Ambulatory Visit: Payer: Self-pay | Admitting: Nurse Practitioner

## 2021-01-26 DIAGNOSIS — F419 Anxiety disorder, unspecified: Secondary | ICD-10-CM | POA: Insufficient documentation

## 2021-01-26 DIAGNOSIS — R079 Chest pain, unspecified: Secondary | ICD-10-CM | POA: Insufficient documentation

## 2021-01-26 DIAGNOSIS — G4726 Circadian rhythm sleep disorder, shift work type: Secondary | ICD-10-CM | POA: Insufficient documentation

## 2021-01-26 DIAGNOSIS — R030 Elevated blood-pressure reading, without diagnosis of hypertension: Secondary | ICD-10-CM

## 2021-01-26 DIAGNOSIS — Z3041 Encounter for surveillance of contraceptive pills: Secondary | ICD-10-CM | POA: Insufficient documentation

## 2021-01-31 ENCOUNTER — Other Ambulatory Visit: Payer: Self-pay

## 2021-01-31 DIAGNOSIS — Z130 Encounter for screening for diseases of the blood and blood-forming organs and certain disorders involving the immune mechanism: Secondary | ICD-10-CM

## 2021-01-31 DIAGNOSIS — R5382 Chronic fatigue, unspecified: Secondary | ICD-10-CM

## 2021-01-31 DIAGNOSIS — M130 Polyarthritis, unspecified: Secondary | ICD-10-CM

## 2021-01-31 DIAGNOSIS — Z1321 Encounter for screening for nutritional disorder: Secondary | ICD-10-CM

## 2021-02-01 ENCOUNTER — Other Ambulatory Visit: Payer: BC Managed Care – PPO

## 2021-02-01 ENCOUNTER — Other Ambulatory Visit: Payer: Self-pay | Admitting: Nurse Practitioner

## 2021-02-01 DIAGNOSIS — M130 Polyarthritis, unspecified: Secondary | ICD-10-CM

## 2021-02-01 DIAGNOSIS — Z13228 Encounter for screening for other metabolic disorders: Secondary | ICD-10-CM

## 2021-02-01 DIAGNOSIS — R5382 Chronic fatigue, unspecified: Secondary | ICD-10-CM

## 2021-02-01 DIAGNOSIS — J4531 Mild persistent asthma with (acute) exacerbation: Secondary | ICD-10-CM

## 2021-02-02 ENCOUNTER — Other Ambulatory Visit: Payer: Self-pay | Admitting: Nurse Practitioner

## 2021-02-02 NOTE — Telephone Encounter (Signed)
Can you find out of there is a rescue inhaler that the patient's plan does cover? I don't see anywhere in epic where albuterol comes as PV or HFA 200. I'm not sure what they are asking for. She just needs a rescue inhaler.

## 2021-02-03 NOTE — Telephone Encounter (Signed)
Called pt LVM stating she have to contact her Insurance company to see what  Rx is cover

## 2021-02-06 NOTE — Telephone Encounter (Signed)
When she calls back I can send aa new prescription to [facility] for her. Thanks

## 2021-02-06 NOTE — Telephone Encounter (Signed)
Ok, thanks.

## 2021-02-10 NOTE — Telephone Encounter (Signed)
Called pt no answer VM is full unable to leave a message

## 2021-02-20 ENCOUNTER — Ambulatory Visit (INDEPENDENT_AMBULATORY_CARE_PROVIDER_SITE_OTHER): Payer: BC Managed Care – PPO | Admitting: Nurse Practitioner

## 2021-02-20 ENCOUNTER — Other Ambulatory Visit: Payer: Self-pay

## 2021-02-20 ENCOUNTER — Encounter: Payer: Self-pay | Admitting: Nurse Practitioner

## 2021-02-20 VITALS — BP 124/85 | HR 105 | Temp 99.0°F | Ht 64.0 in | Wt 234.0 lb

## 2021-02-20 DIAGNOSIS — K029 Dental caries, unspecified: Secondary | ICD-10-CM

## 2021-02-20 MED ORDER — LIDOCAINE VISCOUS HCL 2 % MT SOLN: 15.0000 mL | Freq: Four times a day (QID) | OROMUCOSAL | 0 refills | Status: AC | PRN

## 2021-02-20 MED ORDER — AMOXICILLIN-POT CLAVULANATE 875-125 MG PO TABS: 1.0000 | ORAL_TABLET | Freq: Two times a day (BID) | ORAL | 0 refills | Status: DC

## 2021-02-20 NOTE — Progress Notes (Signed)
Acute Office Visit  Subjective:    Patient ID: Anna Burke, female    DOB: [birth year], 40 y.o.   MRN: [id number]  Chief Complaint  Patient presents with   Dental Pain    The patient states that she has had cracked tooth in bottom left area of her jaw. Did have a filling. Recently noted that the filling fell off. Yesterday, she started noticing tenderness in this area. She has some swelling of the gums around effected tooth. She does have some left sided jaw pain anda little tenderness in the left maxillary sinus. She does not have a dentist at this time and knows she needs to find one in the near future.   Dental Pain  This is a new problem. The current episode started yesterday. The problem occurs constantly. Associated symptoms include facial pain and sinus pressure. Pertinent negatives include no fever or oral bleeding. She has tried acetaminophen and NSAIDs for the symptoms. The treatment provided mild relief.    Past Medical History:  Diagnosis Date   Depression    Insomnia    Sleep study on 09/03/2009 by Dr. Maple Hudson consistent with insomnia but no organic sleep disturbance identified.   Interstitial cystitis    Dr. Logan Bores [facility]    History reviewed. No pertinent surgical history.  Family History  Problem Relation Age of Onset   Stroke Mother    Arthritis Mother    Heart disease Mother    Heart attack Mother    Heart attack Father    Arthritis Father    High blood pressure Father     Social History   Socioeconomic History   Marital status: Divorced    Spouse name: Not on file   Number of children: 0   Years of education: Not on file   Highest education level: Not on file  Occupational History   Occupation: umemployed    Employer: NOT EMPLOYED  Tobacco Use   Smoking status: Never   Smokeless tobacco: Never  Substance and Sexual Activity   Alcohol use: Yes   Drug use: No   Sexual activity: Yes  Other Topics Concern   Not on file  Social History  Narrative   Not on file   Social Determinants of Health   Financial Resource Strain: Not on file  Food Insecurity: Not on file  Transportation Needs: Not on file  Physical Activity: Not on file  Stress: Not on file  Social Connections: Not on file  Intimate Partner Violence: Not on file    Outpatient Medications Prior to Visit  Medication Sig Dispense Refill   albuterol (VENTOLIN HFA) 108 (90 Base) MCG/ACT inhaler Inhale 2 puffs into the lungs every 6 (six) hours as needed for wheezing or shortness of breath. 3 each 3   buPROPion (WELLBUTRIN XL) 300 MG 24 hr tablet Take 1 tablet (300 mg total) by mouth daily. 90 tablet 3   diazepam (VALIUM) 5 MG tablet TAKE 1 TABLET DAILY AS NEEDED 30 tablet 0   esomeprazole (NEXIUM) 40 MG capsule Take 1 capsule (40 mg total) by mouth daily. 90 capsule 1   famotidine (PEPCID) 40 MG tablet Take 1 tablet (40 mg total) by mouth daily. 90 tablet 1   hydrOXYzine (ATARAX/VISTARIL) 25 MG tablet Take 1 tablet (25 mg total) by mouth every 8 (eight) hours as needed. 270 tablet 1   lisinopril (ZESTRIL) 5 MG tablet Take 1 tablet (5 mg total) by mouth daily. 90 tablet 1   modafinil (PROVIGIL) 100  MG tablet Take 1 tablet (100 mg total) by mouth daily. 30 tablet 0   montelukast (SINGULAIR) 10 MG tablet Take 1 tablet (10 mg total) by mouth at bedtime. 90 tablet 1   norethindrone-ethinyl estradiol-FE (BLISOVI FE 1/20) 1-20 MG-MCG tablet Take 1 tablet by mouth daily. 84 tablet 3   No facility-administered medications prior to visit.    Allergies  Allergen Reactions   Sulfa Antibiotics Hives   Ortho Evra [Norelgestromin-Eth Estradiol] Rash and Other (See Comments)    Elevated BP   Shellfish Allergy Nausea And Vomiting    Review of Systems  Constitutional:  Negative for activity change, appetite change, chills, fatigue and fever.  HENT:  Positive for dental problem and sinus pressure. Negative for congestion, postnasal drip, rhinorrhea, sinus pain, sneezing and  sore throat.        Tooth and gum pain in the left lower part of the faw.   Eyes: Negative.   Respiratory:  Negative for cough, chest tightness, shortness of breath and wheezing.   Cardiovascular:  Negative for chest pain and palpitations.  Gastrointestinal:  Negative for abdominal pain, constipation, diarrhea, nausea and vomiting.  Endocrine: Negative for cold intolerance, heat intolerance, polydipsia and polyuria.  Genitourinary:  Negative for dyspareunia, dysuria, flank pain, frequency and urgency.  Musculoskeletal:  Negative for arthralgias, back pain and myalgias.  Skin:  Negative for rash.  Allergic/Immunologic: Negative for environmental allergies.  Neurological:  Negative for dizziness, weakness and headaches.  Hematological:  Positive for adenopathy.  Psychiatric/Behavioral:  The patient is not nervous/anxious.       Objective:    Physical Exam Vitals and nursing note reviewed.  Constitutional:      Appearance: Normal appearance. She is well-developed. She is obese.  HENT:     Head: Normocephalic.     Nose: Nose normal.     Mouth/Throat:     Mouth: Mucous membranes are moist.     Pharynx: Oropharynx is clear.   Eyes:     Extraocular Movements: Extraocular movements intact.     Conjunctiva/sclera: Conjunctivae normal.     Pupils: Pupils are equal, round, and reactive to light.  Cardiovascular:     Rate and Rhythm: Normal rate and regular rhythm.     Pulses: Normal pulses.     Heart sounds: Normal heart sounds.  Pulmonary:     Effort: Pulmonary effort is normal.     Breath sounds: Normal breath sounds.  Abdominal:     Palpations: Abdomen is soft.  Musculoskeletal:        General: Normal range of motion.     Cervical back: Normal range of motion and neck supple.  Lymphadenopathy:     Cervical: Cervical adenopathy present.  Skin:    General: Skin is warm and dry.     Capillary Refill: Capillary refill takes less than 2 seconds.  Neurological:     General: No  focal deficit present.     Mental Status: She is alert and oriented to person, place, and time.  Psychiatric:        Mood and Affect: Mood normal.        Behavior: Behavior normal.        Thought Content: Thought content normal.        Judgment: Judgment normal.    Today's Vitals   02/20/21 0943  BP: 124/85  Pulse: (!) 105  Temp: 99 F (37.2 C)  SpO2: 99%  Weight: 234 lb (106.1 kg)  Height: 5\' 4"  (1.626 m)  Body mass index is 40.17 kg/m.   Wt Readings from Last 3 Encounters:  02/20/21 234 lb (106.1 kg)  01/25/21 232 lb 14.4 oz (105.6 kg)  01/18/21 225 lb (102.1 kg)    Health Maintenance Due  Topic Date Due   Pneumococcal Vaccine 47-81 Years old (1 - PCV) Never done   PAP SMEAR-Modifier  03/29/2018   COVID-19 Vaccine (4 - Booster) 07/14/2020    There are no preventive care reminders to display for this patient.   Lab Results  Component Value Date   TSH 2.45 01/08/2019   Lab Results  Component Value Date   WBC 9.1 01/18/2021   HGB 13.7 01/18/2021   HCT 40.4 01/18/2021   MCV 87.8 01/18/2021   PLT 417 (H) 01/18/2021   Lab Results  Component Value Date   NA 136 01/18/2021   K 3.6 01/18/2021   CO2 21 (L) 01/18/2021   GLUCOSE 110 (H) 01/18/2021   BUN 10 01/18/2021   CREATININE 0.80 01/18/2021   BILITOT 0.5 01/25/2016   ALKPHOS 88 01/25/2016   AST 16 01/25/2016   ALT 15 01/25/2016   PROT 7.7 01/25/2016   ALBUMIN 4.1 01/25/2016   CALCIUM 9.6 01/18/2021   ANIONGAP 11 01/18/2021   GFR 74.81 01/08/2019   Lab Results  Component Value Date   CHOL 176 03/25/2015   Lab Results  Component Value Date   HDL 56.70 03/25/2015   Lab Results  Component Value Date   LDLCALC 85 03/25/2015   Lab Results  Component Value Date   TRIG 171.0 (H) 03/25/2015   Lab Results  Component Value Date   CHOLHDL 3 03/25/2015   No results found for: HGBA1C     Assessment & Plan:  1. Dental caries Start augmentin 875mg  twice daily for next 10 days. Rinse the couth  with warm salt water as needed to reduce bacteria and infection. Recommend she find and follow up with dentist as soon as possible. She voiced agreement and understanding.  - amoxicillin-clavulanate (AUGMENTIN) 875-125 MG tablet; Take 1 tablet by mouth 2 (two) times daily.  Dispense: 20 tablet; Refill: 0  2. Pain due to dental caries May apply small amount of viscous lidocaine to effected area 3 to 4 times daily as needed for dental pain.  - lidocaine (XYLOCAINE) 2 % solution; Use as directed 15 mLs in the mouth or throat every 6 (six) hours as needed for mouth pain.  Dispense: 10 mL; Refill: 0   Problem List Items Addressed This Visit       Digestive   Dental caries - Primary   Relevant Medications   amoxicillin-clavulanate (AUGMENTIN) 875-125 MG tablet   Other Visit Diagnoses     Pain due to dental caries       Relevant Medications   lidocaine (XYLOCAINE) 2 % solution        Meds ordered this encounter  Medications   amoxicillin-clavulanate (AUGMENTIN) 875-125 MG tablet    Sig: Take 1 tablet by mouth 2 (two) times daily.    Dispense:  20 tablet    Refill:  0    Order Specific Question:   Supervising Provider    Answer:   [[id number]]   lidocaine (XYLOCAINE) 2 % solution    Sig: Use as directed 15 mLs in the mouth or throat every 6 (six) hours as needed for mouth pain.    Dispense:  10 mL    Refill:  0    Order Specific Question:  Supervising Provider    Answer:   Monica Becton [[id number]]     Carlean Jews, NP

## 2021-02-27 ENCOUNTER — Other Ambulatory Visit: Payer: Self-pay | Admitting: Nurse Practitioner

## 2021-02-27 ENCOUNTER — Encounter: Payer: Self-pay | Admitting: Nurse Practitioner

## 2021-02-27 DIAGNOSIS — B3731 Acute candidiasis of vulva and vagina: Secondary | ICD-10-CM

## 2021-02-27 MED ORDER — FLUCONAZOLE 150 MG PO TABS: ORAL_TABLET | ORAL | 0 refills | Status: DC

## 2021-02-28 ENCOUNTER — Other Ambulatory Visit: Payer: Self-pay | Admitting: Nurse Practitioner

## 2021-02-28 DIAGNOSIS — B3731 Acute candidiasis of vulva and vagina: Secondary | ICD-10-CM

## 2021-02-28 MED ORDER — FLUCONAZOLE 150 MG PO TABS: ORAL_TABLET | ORAL | 0 refills | Status: DC

## 2021-04-20 ENCOUNTER — Encounter: Payer: Self-pay | Admitting: Nurse Practitioner

## 2021-04-20 ENCOUNTER — Other Ambulatory Visit: Payer: Self-pay | Admitting: Nurse Practitioner

## 2021-04-20 DIAGNOSIS — G4726 Circadian rhythm sleep disorder, shift work type: Secondary | ICD-10-CM

## 2021-04-20 DIAGNOSIS — R5382 Chronic fatigue, unspecified: Secondary | ICD-10-CM

## 2021-04-20 MED ORDER — MODAFINIL 100 MG PO TABS: 100.0000 mg | ORAL_TABLET | Freq: Every day | ORAL | 0 refills | Status: DC

## 2021-04-27 ENCOUNTER — Other Ambulatory Visit: Payer: Self-pay | Admitting: Nurse Practitioner

## 2021-04-27 ENCOUNTER — Ambulatory Visit: Payer: BC Managed Care – PPO | Admitting: Nurse Practitioner

## 2021-04-27 DIAGNOSIS — G4726 Circadian rhythm sleep disorder, shift work type: Secondary | ICD-10-CM

## 2021-04-27 DIAGNOSIS — R5382 Chronic fatigue, unspecified: Secondary | ICD-10-CM

## 2021-04-27 NOTE — Telephone Encounter (Signed)
Didn't we take care of this already?

## 2021-04-28 MED ORDER — MODAFINIL 100 MG PO TABS: 100.0000 mg | ORAL_TABLET | Freq: Every day | ORAL | 1 refills | Status: DC

## 2021-04-28 NOTE — Telephone Encounter (Signed)
rx sent. PA paperwork to be faxed today

## 2021-06-09 ENCOUNTER — Encounter: Payer: Self-pay | Admitting: Nurse Practitioner

## 2021-06-12 ENCOUNTER — Other Ambulatory Visit: Payer: Self-pay | Admitting: Nurse Practitioner

## 2021-06-12 DIAGNOSIS — J4531 Mild persistent asthma with (acute) exacerbation: Secondary | ICD-10-CM

## 2021-06-12 MED ORDER — MONTELUKAST SODIUM 10 MG PO TABS: 10.0000 mg | ORAL_TABLET | Freq: Every day | ORAL | 1 refills | Status: DC

## 2021-06-13 ENCOUNTER — Other Ambulatory Visit: Payer: Self-pay | Admitting: Nurse Practitioner

## 2021-06-13 DIAGNOSIS — J4531 Mild persistent asthma with (acute) exacerbation: Secondary | ICD-10-CM

## 2021-06-13 MED ORDER — MONTELUKAST SODIUM 10 MG PO TABS: 10.0000 mg | ORAL_TABLET | Freq: Every day | ORAL | 1 refills | Status: DC

## 2021-06-20 ENCOUNTER — Encounter: Payer: Self-pay | Admitting: Nurse Practitioner

## 2021-06-20 ENCOUNTER — Ambulatory Visit: Payer: BC Managed Care – PPO | Admitting: Nurse Practitioner

## 2021-06-20 ENCOUNTER — Other Ambulatory Visit: Payer: Self-pay

## 2021-06-20 VITALS — BP 139/82 | HR 92 | Temp 98.6°F | Ht 64.0 in | Wt 236.9 lb

## 2021-06-20 DIAGNOSIS — J4531 Mild persistent asthma with (acute) exacerbation: Secondary | ICD-10-CM

## 2021-06-20 DIAGNOSIS — N3010 Interstitial cystitis (chronic) without hematuria: Secondary | ICD-10-CM

## 2021-06-20 DIAGNOSIS — J0140 Acute pansinusitis, unspecified: Secondary | ICD-10-CM

## 2021-06-20 DIAGNOSIS — J309 Allergic rhinitis, unspecified: Secondary | ICD-10-CM

## 2021-06-20 NOTE — Progress Notes (Signed)
Established patient visit ? ? ?Patient: Anna Burke   DOB: [birth year]   41 y.o. Female  MRN: [id number] ?Visit Date: 06/20/2021 ? ? ?Chief Complaint  ?Patient presents with  ? Follow-up  ? ?Subjective  ?  ?HPI  ?The patient states that she has had to take a couple of days off work. She is greatly effected per pollen. Has been having allergy type symptoms. Has itchy, scratchy eyes, sore throat, headaches, and severe fatigue. She states that this has also caused her interstitial cystitis flare up. She is having severe pain in her bladder and down into her legs. Also having to urinate very frequently. She states that she has had to change her shifts a few times which also makes her feel pretty poorly. She states that she is gradually feeling a little better. Her new shift has improved. She wsa able to sleep well while she was off during the illness.  ?Due to the illness, she missed work Wednesday, Thursday, Friday, Saturday, and Sunday. She did go to work on Monday. Tuesday is her only day off. With new schedule, she will be off on Tuesday and Wednesday. She will need to have FMLA paperwork filled out. She has put in for the sick leave. Paperwork will be sent to this office soon.  ? ? ?Medications: ?Outpatient Medications Prior to Visit  ?Medication Sig  ? albuterol (VENTOLIN HFA) 108 (90 Base) MCG/ACT inhaler Inhale 2 puffs into the lungs every 6 (six) hours as needed for wheezing or shortness of breath.  ? buPROPion (WELLBUTRIN XL) 300 MG 24 hr tablet Take 1 tablet (300 mg total) by mouth daily.  ? diazepam (VALIUM) 5 MG tablet TAKE 1 TABLET DAILY AS NEEDED  ? esomeprazole (NEXIUM) 40 MG capsule Take 1 capsule (40 mg total) by mouth daily.  ? famotidine (PEPCID) 40 MG tablet Take 1 tablet (40 mg total) by mouth daily.  ? hydrOXYzine (ATARAX/VISTARIL) 25 MG tablet Take 1 tablet (25 mg total) by mouth every 8 (eight) hours as needed.  ? lidocaine (XYLOCAINE) 2 % solution Use as directed 15 mLs in the mouth or  throat every 6 (six) hours as needed for mouth pain.  ? lisinopril (ZESTRIL) 5 MG tablet Take 1 tablet (5 mg total) by mouth daily.  ? modafinil (PROVIGIL) 100 MG tablet Take 1 tablet (100 mg total) by mouth daily.  ? montelukast (SINGULAIR) 10 MG tablet Take 1 tablet (10 mg total) by mouth at bedtime.  ? norethindrone-ethinyl estradiol-FE (BLISOVI FE 1/20) 1-20 MG-MCG tablet Take 1 tablet by mouth daily.  ? [DISCONTINUED] amoxicillin-clavulanate (AUGMENTIN) 875-125 MG tablet Take 1 tablet by mouth 2 (two) times daily.  ? [DISCONTINUED] fluconazole (DIFLUCAN) 150 MG tablet Take 1 tablet po once. May repeat dose in 3 days as needed for persistent symptoms.  ? ?No facility-administered medications prior to visit.  ? ? ?Review of Systems  ?Constitutional:  Positive for fatigue. Negative for activity change, appetite change, chills and fever.  ?HENT:  Positive for congestion, ear pain, facial swelling, mouth sores, sinus pressure, sinus pain and sneezing. Negative for postnasal drip, rhinorrhea and sore throat.   ?Eyes: Negative.   ?Respiratory:  Negative for cough, chest tightness, shortness of breath and wheezing.   ?Cardiovascular:  Negative for chest pain and palpitations.  ?Gastrointestinal:  Negative for abdominal pain, constipation, diarrhea, nausea and vomiting.  ?Endocrine: Negative for cold intolerance, heat intolerance, polydipsia and polyuria.  ?Genitourinary:  Positive for dysuria, frequency and urgency. Negative for dyspareunia and flank  pain.  ?Musculoskeletal:  Positive for back pain and myalgias. Negative for arthralgias.  ?Skin:  Negative for rash.  ?Allergic/Immunologic: Positive for environmental allergies.  ?Neurological:  Positive for headaches. Negative for dizziness and weakness.  ?Hematological:  Negative for adenopathy.  ?Psychiatric/Behavioral:  The patient is not nervous/anxious.   ? ? ? ? Objective  ?  ? ?Today's Vitals  ? 06/20/21 1103  ?BP: 139/82  ?Pulse: 92  ?Temp: 98.6 ?F (37 ?C)   ?SpO2: 97%  ?Weight: 236 lb 14.4 oz (107.5 kg)  ?Height: 5\' 4"  (1.626 m)  ? ?Body mass index is 40.66 kg/m?.  ? ?BP Readings from Last 3 Encounters:  ?06/20/21 139/82  ?02/20/21 124/85  ?01/25/21 121/81  ?  ?Wt Readings from Last 3 Encounters:  ?06/20/21 236 lb 14.4 oz (107.5 kg)  ?02/20/21 234 lb (106.1 kg)  ?01/25/21 232 lb 14.4 oz (105.6 kg)  ?  ?Physical Exam ?Vitals and nursing note reviewed.  ?Constitutional:   ?   Appearance: Normal appearance. She is well-developed. She is obese. She is ill-appearing.  ?HENT:  ?   Head: Normocephalic.  ?   Right Ear: Hearing, ear canal and external ear normal. Tympanic membrane is erythematous and bulging.  ?   Left Ear: Hearing, ear canal and external ear normal. Tympanic membrane is erythematous and bulging.  ?   Nose: Congestion present.  ?   Right Sinus: Maxillary sinus tenderness and frontal sinus tenderness present.  ?   Left Sinus: Maxillary sinus tenderness and frontal sinus tenderness present.  ?   Mouth/Throat:  ?   Pharynx: Posterior oropharyngeal erythema present.  ?Eyes:  ?   Pupils: Pupils are equal, round, and reactive to light.  ?Cardiovascular:  ?   Rate and Rhythm: Normal rate and regular rhythm.  ?   Pulses: Normal pulses.  ?   Heart sounds: Normal heart sounds.  ?Pulmonary:  ?   Effort: Pulmonary effort is normal.  ?   Breath sounds: Normal breath sounds.  ?Abdominal:  ?   Palpations: Abdomen is soft.  ?Musculoskeletal:     ?   General: Normal range of motion.  ?   Cervical back: Normal range of motion and neck supple. No pain with movement.  ?Lymphadenopathy:  ?   Cervical: Cervical adenopathy present.  ?Skin: ?   General: Skin is warm and dry.  ?   Capillary Refill: Capillary refill takes less than 2 seconds.  ?Neurological:  ?   General: No focal deficit present.  ?   Mental Status: She is alert and oriented to person, place, and time.  ?Psychiatric:     ?   Mood and Affect: Mood normal.     ?   Behavior: Behavior normal.     ?   Thought Content:  Thought content normal.     ?   Judgment: Judgment normal.  ?  ? ? Assessment & Plan  ?  ?1. Acute non-recurrent pansinusitis ?Patient missed five days of work due to symptoms associated with sinusitis. Now improving without antibiotics. Recommend she continue to treat symptoms as needed. Will complete FMLA paperwork for acute illness when it is made available.  ? ?2. Chronic allergic rhinitis ?Continue all allergy medications as prescribed  ? ?3. Mild persistent reactive airway disease with acute exacerbation ?Stable. Continue respiratory medications as prescribed  ? ?4. Interstitial cystitis ?Chronic. Patient will eventually supply FMLA paperwork to cover days when symptoms are severe causing her to miss work.  ?  ?Problem List  Items Addressed This Visit   ? ?  ? Respiratory  ? Reactive airway disease  ?  ? Genitourinary  ? Interstitial cystitis  ? ?Other Visit Diagnoses   ? ? Acute non-recurrent pansinusitis    -  Primary  ? Chronic allergic rhinitis      ? ?  ?  ? ?Return in about 3 months (around 09/20/2021) for med refills, FBW at time of visit.  ?   ? ? ? ? ?Carlean Jews, NP  ?[facility] ?[phone] (phone) ?[phone] (fax) ? ? [facility]  ?

## 2021-06-23 ENCOUNTER — Encounter: Payer: Self-pay | Admitting: Nurse Practitioner

## 2021-06-23 NOTE — Telephone Encounter (Signed)
Patient FMLA form has been printed and filled in by CMA. Form is in [employer] awaiting signature. AS, CMA ?

## 2021-06-29 ENCOUNTER — Other Ambulatory Visit: Payer: Self-pay | Admitting: Nurse Practitioner

## 2021-06-29 DIAGNOSIS — R030 Elevated blood-pressure reading, without diagnosis of hypertension: Secondary | ICD-10-CM

## 2021-07-07 ENCOUNTER — Telehealth: Payer: Self-pay

## 2021-07-07 NOTE — Telephone Encounter (Signed)
Paperwork is ready for pick up.

## 2021-07-08 ENCOUNTER — Other Ambulatory Visit: Payer: Self-pay | Admitting: Nurse Practitioner

## 2021-07-08 DIAGNOSIS — J4531 Mild persistent asthma with (acute) exacerbation: Secondary | ICD-10-CM

## 2021-07-19 ENCOUNTER — Ambulatory Visit (INDEPENDENT_AMBULATORY_CARE_PROVIDER_SITE_OTHER): Payer: BC Managed Care – PPO | Admitting: Nurse Practitioner

## 2021-07-19 ENCOUNTER — Encounter: Payer: Self-pay | Admitting: Nurse Practitioner

## 2021-07-19 VITALS — BP 135/84 | HR 100 | Temp 97.9°F | Ht 64.0 in | Wt 235.6 lb

## 2021-07-19 DIAGNOSIS — G43009 Migraine without aura, not intractable, without status migrainosus: Secondary | ICD-10-CM

## 2021-07-19 DIAGNOSIS — Z6841 Body Mass Index (BMI) 40.0 and over, adult: Secondary | ICD-10-CM

## 2021-07-19 DIAGNOSIS — J309 Allergic rhinitis, unspecified: Secondary | ICD-10-CM | POA: Diagnosis not present

## 2021-07-19 DIAGNOSIS — R5382 Chronic fatigue, unspecified: Secondary | ICD-10-CM

## 2021-07-19 MED ORDER — RIZATRIPTAN BENZOATE 10 MG PO TABS: 10.0000 mg | ORAL_TABLET | ORAL | 0 refills | Status: DC | PRN

## 2021-07-19 NOTE — Progress Notes (Signed)
Established patient visit ? ? ?Patient: Anna Burke   DOB: Sep 12, 1980   40 y.o. Female  MRN: [id number] ?Visit Date: 07/19/2021 ? ?Chief Complaint  ?Patient presents with  ? Allergies  ? ?Subjective  ?  ?Headache  ?This is a new problem. The current episode started in the past 7 days. The problem occurs intermittently. The problem has been gradually worsening. The pain is located in the Frontal region. The quality of the pain is described as shooting and throbbing. The pain is moderate. Associated symptoms include eye pain, eye redness, eye watering, nausea, rhinorrhea and sinus pressure. Pertinent negatives include no abdominal pain, back pain, coughing, dizziness, fever, sore throat, vomiting or weakness. Associated symptoms comments: fatigue. The symptoms are aggravated by bright light, fatigue, noise and work. She has tried acetaminophen, darkened room, NSAIDs and Excedrin for the symptoms. The treatment provided mild relief. Her past medical history is significant for migraine headaches.   ? ?States that this is the second time this year she has had a headache like this. Took multiple medications to resolve the headache. Felt bad for a few days after resolution of the headache. Had to take a few days off of work as she is on the phone often with headset. Makes headache worse. She missed pril 6th through April 10 and will need to have FMLA paperwork filled out to cover the days that she missed.  ? ?Medications: ?Outpatient Medications Prior to Visit  ?Medication Sig  ? albuterol (VENTOLIN HFA) 108 (90 Base) MCG/ACT inhaler Inhale 2 puffs into the lungs every 6 (six) hours as needed for wheezing or shortness of breath.  ? buPROPion (WELLBUTRIN XL) 300 MG 24 hr tablet Take 1 tablet (300 mg total) by mouth daily.  ? diazepam (VALIUM) 5 MG tablet TAKE 1 TABLET DAILY AS NEEDED  ? esomeprazole (NEXIUM) 40 MG capsule Take 1 capsule (40 mg total) by mouth daily.  ? famotidine (PEPCID) 40 MG tablet Take 1 tablet  (40 mg total) by mouth daily.  ? hydrOXYzine (ATARAX/VISTARIL) 25 MG tablet Take 1 tablet (25 mg total) by mouth every 8 (eight) hours as needed.  ? lidocaine (XYLOCAINE) 2 % solution Use as directed 15 mLs in the mouth or throat every 6 (six) hours as needed for mouth pain.  ? lisinopril (ZESTRIL) 5 MG tablet TAKE 1 TABLET DAILY  ? modafinil (PROVIGIL) 100 MG tablet Take 1 tablet (100 mg total) by mouth daily.  ? montelukast (SINGULAIR) 10 MG tablet TAKE 1 TABLET BY MOUTH EVERYDAY AT BEDTIME  ? norethindrone-ethinyl estradiol-FE (BLISOVI FE 1/20) 1-20 MG-MCG tablet Take 1 tablet by mouth daily.  ? ?No facility-administered medications prior to visit.  ? ? ?Review of Systems  ?Constitutional:  Positive for fatigue. Negative for activity change, appetite change, chills and fever.  ?HENT:  Positive for congestion, rhinorrhea, sinus pressure and sinus pain. Negative for postnasal drip, sneezing and sore throat.   ?Eyes:  Positive for pain and redness.  ?Respiratory:  Negative for cough, chest tightness, shortness of breath and wheezing.   ?Cardiovascular:  Negative for chest pain and palpitations.  ?Gastrointestinal:  Positive for nausea. Negative for abdominal pain, constipation, diarrhea and vomiting.  ?Endocrine: Negative for cold intolerance, heat intolerance, polydipsia and polyuria.  ?Genitourinary:  Negative for dyspareunia, dysuria, flank pain, frequency and urgency.  ?Musculoskeletal:  Negative for arthralgias, back pain and myalgias.  ?Skin:  Negative for rash.  ?Allergic/Immunologic: Positive for environmental allergies.  ?Neurological:  Positive for headaches. Negative for dizziness and  weakness.  ?Hematological:  Negative for adenopathy.  ?Psychiatric/Behavioral:  The patient is not nervous/anxious.   ? ? Objective  ?  ? ?Today's Vitals  ? 07/19/21 1455  ?BP: 135/84  ?Pulse: 100  ?Temp: 97.9 ?F (36.6 ?C)  ?SpO2: 97%  ?Weight: 235 lb 9.6 oz (106.9 kg)  ?Height: 5\' 4"  (1.626 m)  ? ?Body mass index is 40.44  kg/m?.  ? ?Physical Exam ?Vitals and nursing note reviewed.  ?Constitutional:   ?   Appearance: Normal appearance. She is well-developed. She is obese.  ?HENT:  ?   Head: Normocephalic and atraumatic.  ?   Nose: Nose normal.  ?   Mouth/Throat:  ?   Mouth: Mucous membranes are moist.  ?   Pharynx: Oropharynx is clear.  ?Eyes:  ?   Extraocular Movements: Extraocular movements intact.  ?   Conjunctiva/sclera: Conjunctivae normal.  ?   Pupils: Pupils are equal, round, and reactive to light.  ?Cardiovascular:  ?   Rate and Rhythm: Normal rate and regular rhythm.  ?   Pulses: Normal pulses.  ?   Heart sounds: Normal heart sounds.  ?Pulmonary:  ?   Effort: Pulmonary effort is normal.  ?   Breath sounds: Normal breath sounds.  ?Abdominal:  ?   Palpations: Abdomen is soft.  ?Musculoskeletal:     ?   General: Normal range of motion.  ?   Cervical back: Normal range of motion and neck supple.  ?Lymphadenopathy:  ?   Cervical: No cervical adenopathy.  ?Skin: ?   General: Skin is warm and dry.  ?   Capillary Refill: Capillary refill takes less than 2 seconds.  ?Neurological:  ?   General: No focal deficit present.  ?   Mental Status: She is alert and oriented to person, place, and time.  ?Psychiatric:     ?   Mood and Affect: Mood normal.     ?   Behavior: Behavior normal.     ?   Thought Content: Thought content normal.     ?   Judgment: Judgment normal.  ?  ? Assessment & Plan  ?  ? ?1. Migraine without aura and without status migrainosus, not intractable ?Patient describing headache consistent with migraine headache. Trial of maxalt 10mg  to take as needed for acute migraine. May repeat dose in 2 hours if needed. Will complete FMLA paperwork for missed work due to recent acute migraine headache  ?- rizatriptan (MAXALT) 10 MG tablet; Take 1 tablet (10 mg total) by mouth as needed for migraine. May repeat in 2 hours if needed  Dispense: 10 tablet; Refill: 0 ? ?2. Chronic allergic rhinitis ?Recommend she add OTC Xyzal or Zyrtec  daily. Continue to take singulair daily.  ? ?3. Chronic fatigue ?Check thyroid and anemia panels prior to next visit. Also check Hgba1c. Will review and treat as indicated at next visit.  ? ?4. Body mass index (BMI) of 40.1-44.9 in adult [facility]) ?Discussed lowering calorie intake to 1500 calories per day and incorporating exercise into daily routine to help lose weight.  ? ?Return in about 6 weeks (around 08/30/2021) for headaches - , FBW a week prior to visit, add Free T4, ferritin, B12, and vitamin d d/t fatigue .  ?   ? ? ? ?[facility], NP  ?[facility] ?[phone] (phone) ?[phone] (fax) ? ?[facility] ?

## 2021-07-19 NOTE — Patient Instructions (Signed)
Fat and Cholesterol Restricted Eating Plan Getting too much fat and cholesterol in your diet may cause health problems. Choosing the right foods helps keep your fat and cholesterol at normal levels. This can keep you from getting certain diseases. Your doctor may recommend an eating plan that includes: Total fat: ______% or less of total calories a day. This is ______g of fat a day. Saturated fat: ______% or less of total calories a day. This is ______g of saturated fat a day. Cholesterol: less than _________mg a day. Fiber: ______g a day. What are tips for following this plan? General tips Work with your doctor to lose weight if you need to. Avoid: Foods with added sugar. Fried foods. Foods with trans fat or partially hydrogenated oils. This includes some margarines and baked goods. If you drink alcohol: Limit how much you have to: 0-1 drink a day for women who are not pregnant. 0-2 drinks a day for men. Know how much alcohol is in a drink. In the [place] one drink equals one 12 oz bottle of beer (355 mL), one 5 oz glass of wine (148 mL), or one 1 oz glass of hard liquor (44 mL). Reading food labels Check food labels for: Trans fats. Partially hydrogenated oils. Saturated fat (g) in each serving. Cholesterol (mg) in each serving. Fiber (g) in each serving. Choose foods with healthy fats, such as: Monounsaturated fats and polyunsaturated fats. These include olive and canola oil, flaxseeds, walnuts, almonds, and seeds. Omega-3 fats. These are found in certain fish, flaxseed oil, and ground flaxseeds. Choose grain products that have whole grains. Look for the word "whole" as the first word in the ingredient list. Cooking Cook foods using low-fat methods. These include baking, boiling, grilling, and broiling. Eat more home-cooked foods. Eat at restaurants and buffets less often. Eat less fast food. Avoid cooking using saturated fats, such as butter, cream, palm oil, palm kernel oil, and  coconut oil. Meal planning  At meals, divide your plate into four equal parts: Fill one-half of your plate with vegetables, green salads, and fruit. Fill one-fourth of your plate with whole grains. Fill one-fourth of your plate with low-fat (lean) protein foods. Eat fish that is high in omega-3 fats at least two times a week. This includes mackerel, tuna, sardines, and salmon. Eat foods that are high in fiber, such as whole grains, beans, apples, pears, berries, broccoli, carrots, peas, and barley. What foods should I eat? Fruits All fresh, canned (in natural juice), or frozen fruits. Vegetables Fresh or frozen vegetables (raw, steamed, roasted, or grilled). Green salads. Grains Whole grains, such as whole wheat or whole grain breads, crackers, cereals, and pasta. Unsweetened oatmeal, bulgur, barley, quinoa, or brown rice. Corn or whole wheat flour tortillas. Meats and other protein foods Ground beef (85% or leaner), grass-fed beef, or beef trimmed of fat. Skinless chicken or turkey. Ground chicken or turkey. Pork trimmed of fat. All fish and seafood. Egg whites. Dried beans, peas, or lentils. Unsalted nuts or seeds. Unsalted canned beans. Nut butters without added sugar or oil. Dairy Low-fat or nonfat dairy products, such as skim or 1% milk, 2% or reduced-fat cheeses, low-fat and fat-free ricotta or cottage cheese, or plain low-fat and nonfat yogurt. Fats and oils Tub margarine without trans fats. Light or reduced-fat mayonnaise and salad dressings. Avocado. Olive, canola, sesame, or safflower oils. The items listed above may not be a complete list of foods and beverages you can eat. Contact a dietitian for more information. What foods   should I avoid? Fruits Canned fruit in heavy syrup. Fruit in cream or butter sauce. Fried fruit. Vegetables Vegetables cooked in cheese, cream, or butter sauce. Fried vegetables. Grains White bread. White pasta. White rice. Cornbread. Bagels, pastries,  and croissants. Crackers and snack foods that contain trans fat and hydrogenated oils. Meats and other protein foods Fatty cuts of meat. Ribs, chicken wings, bacon, sausage, bologna, salami, chitterlings, fatback, hot dogs, bratwurst, and packaged lunch meats. Liver and organ meats. Whole eggs and egg yolks. Chicken and turkey with skin. Fried meat. Dairy Whole or 2% milk, cream, half-and-half, and cream cheese. Whole milk cheeses. Whole-fat or sweetened yogurt. Full-fat cheeses. Nondairy creamers and whipped toppings. Processed cheese, cheese spreads, and cheese curds. Fats and oils Butter, stick margarine, lard, shortening, ghee, or bacon fat. Coconut, palm kernel, and palm oils. Beverages Alcohol. Sugar-sweetened drinks such as sodas, lemonade, and fruit drinks. Sweets and desserts Corn syrup, sugars, honey, and molasses. Candy. Jam and jelly. Syrup. Sweetened cereals. Cookies, pies, cakes, donuts, muffins, and ice cream. The items listed above may not be a complete list of foods and beverages you should avoid. Contact a dietitian for more information. Summary Choosing the right foods helps keep your fat and cholesterol at normal levels. This can keep you from getting certain diseases. At meals, fill one-half of your plate with vegetables, green salads, and fruits. Eat high fiber foods, like whole grains, beans, apples, pears, berries, carrots, peas, and barley. Limit added sugar, saturated fats, alcohol, and fried foods. This information is not intended to replace advice given to you by your health care provider. Make sure you discuss any questions you have with your health care provider. Document Revised: 08/05/2020 Document Reviewed: 08/05/2020 Elsevier Patient Education  2022 Elsevier Inc.  

## 2021-08-06 ENCOUNTER — Other Ambulatory Visit: Payer: Self-pay | Admitting: Nurse Practitioner

## 2021-08-06 DIAGNOSIS — K219 Gastro-esophageal reflux disease without esophagitis: Secondary | ICD-10-CM

## 2021-08-21 ENCOUNTER — Other Ambulatory Visit: Payer: Self-pay

## 2021-08-21 DIAGNOSIS — Z0000 Encounter for general adult medical examination without abnormal findings: Secondary | ICD-10-CM

## 2021-08-21 DIAGNOSIS — R5382 Chronic fatigue, unspecified: Secondary | ICD-10-CM

## 2021-08-21 DIAGNOSIS — E569 Vitamin deficiency, unspecified: Secondary | ICD-10-CM

## 2021-08-21 DIAGNOSIS — E039 Hypothyroidism, unspecified: Secondary | ICD-10-CM

## 2021-08-23 ENCOUNTER — Other Ambulatory Visit: Payer: BC Managed Care – PPO

## 2021-08-23 ENCOUNTER — Ambulatory Visit: Payer: BC Managed Care – PPO | Admitting: Nurse Practitioner

## 2021-08-24 ENCOUNTER — Ambulatory Visit: Payer: BC Managed Care – PPO | Admitting: Nurse Practitioner

## 2021-08-24 ENCOUNTER — Encounter: Payer: Self-pay | Admitting: Nurse Practitioner

## 2021-08-24 VITALS — BP 136/84 | HR 97 | Temp 98.2°F | Ht 64.0 in | Wt 236.1 lb

## 2021-08-24 DIAGNOSIS — R5382 Chronic fatigue, unspecified: Secondary | ICD-10-CM

## 2021-08-24 DIAGNOSIS — Z6841 Body Mass Index (BMI) 40.0 and over, adult: Secondary | ICD-10-CM

## 2021-08-24 DIAGNOSIS — N3010 Interstitial cystitis (chronic) without hematuria: Secondary | ICD-10-CM

## 2021-08-24 NOTE — Progress Notes (Signed)
Established patient visit   Patient: Anna Burke   DOB: 28-Mar-1981   41 y.o. Female  MRN: [id number] Visit Date: 08/24/2021  Chief Complaint  Patient presents with   Cystitis   Subjective    HPI  Increased personal stress which has caused flare of interstitial cystitis.  -increased amount of pain in left leg, left lower back, left sciatic nerve pain. Increased pain in lower back when she bends over.  -severe fatigue and difficulty with focus when flares are active.  -able to take valium which helps with nerve pain. Increases hydroxyzine to twice daily as needed.  -stretching exercises, walking, and heat also help the pain she experiences. Not doing yoga, stretching, and walking, make her pain and fatigue much worse.  Pain has been so severe that she had to miss work from last Thursday through Monday. (She is off work Tuesday and Wednesday). 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 She will need to have FMLA paperwork completed so that she does not get negative attendance points for her employer.   Medications: Outpatient Medications Prior to Visit  Medication Sig   albuterol (VENTOLIN HFA) 108 (90 Base) MCG/ACT inhaler Inhale 2 puffs into the lungs every 6 (six) hours as needed for wheezing or shortness of breath.   buPROPion (WELLBUTRIN XL) 300 MG 24 hr tablet Take 1 tablet (300 mg total) by mouth daily.   diazepam (VALIUM) 5 MG tablet TAKE 1 TABLET DAILY AS NEEDED   esomeprazole (NEXIUM) 40 MG capsule TAKE 1 CAPSULE DAILY   famotidine (PEPCID) 40 MG tablet Take 1 tablet (40 mg total) by mouth daily.   hydrOXYzine (ATARAX/VISTARIL) 25 MG tablet Take 1 tablet (25 mg total) by mouth every 8 (eight) hours as needed.   lidocaine (XYLOCAINE) 2 % solution Use as directed 15 mLs in the mouth or throat every 6 (six) hours as needed for mouth pain.   lisinopril (ZESTRIL) 5 MG tablet TAKE 1 TABLET DAILY   modafinil (PROVIGIL) 100  MG tablet Take 1 tablet (100 mg total) by mouth daily.   montelukast (SINGULAIR) 10 MG tablet TAKE 1 TABLET BY MOUTH EVERYDAY AT BEDTIME   norethindrone-ethinyl estradiol-FE (BLISOVI FE 1/20) 1-20 MG-MCG tablet Take 1 tablet by mouth daily.   rizatriptan (MAXALT) 10 MG tablet Take 1 tablet (10 mg total) by mouth as needed for migraine. May repeat in 2 hours if needed   No facility-administered medications prior to visit.    Review of Systems  Constitutional:  Positive for activity change and fatigue. Negative for appetite change, chills and fever.  HENT:  Negative for congestion, postnasal drip, rhinorrhea, sinus pressure, sinus pain, sneezing and sore throat.   Eyes: Negative.   Respiratory:  Negative for cough, chest tightness, shortness of breath and wheezing.   Cardiovascular:  Negative for chest pain and palpitations.  Gastrointestinal:  Negative for abdominal pain, constipation, diarrhea, nausea and vomiting.  Endocrine: Negative for cold intolerance, heat intolerance, polydipsia and polyuria.  Genitourinary:  Positive for flank pain and urgency. Negative for dyspareunia, dysuria and frequency.  Musculoskeletal:  Positive for arthralgias, joint swelling and myalgias. Negative for back pain.  Skin:  Negative for rash.  Allergic/Immunologic: Negative for environmental allergies.  Neurological:  Positive for dizziness and headaches. Negative for weakness.  Hematological:  Negative for adenopathy.  Psychiatric/Behavioral:  Positive for decreased concentration. The patient is nervous/anxious.     Objective     Today's  Vitals   08/24/21 1520  BP: 136/84  Pulse: 97  Temp: 98.2 F (36.8 C)  SpO2: 97%  Weight: 236 lb 1.9 oz (107.1 kg)  Height: 5\' 4"  (1.626 m)   Body mass index is 40.53 kg/m.   Physical Exam Vitals and nursing note reviewed.  Constitutional:      Appearance: Normal appearance. She is well-developed. She is obese.  HENT:     Head: Normocephalic and atraumatic.   Eyes:     Pupils: Pupils are equal, round, and reactive to light.  Cardiovascular:     Rate and Rhythm: Normal rate and regular rhythm.     Pulses: Normal pulses.     Heart sounds: Normal heart sounds.  Pulmonary:     Effort: Pulmonary effort is normal.     Breath sounds: Normal breath sounds.  Abdominal:     Palpations: Abdomen is soft.  Musculoskeletal:        General: Normal range of motion.     Cervical back: Normal range of motion and neck supple.  Lymphadenopathy:     Cervical: No cervical adenopathy.  Skin:    General: Skin is warm and dry.     Capillary Refill: Capillary refill takes less than 2 seconds.  Neurological:     General: No focal deficit present.     Mental Status: She is alert and oriented to person, place, and time.  Psychiatric:        Mood and Affect: Mood normal.        Behavior: Behavior normal.        Thought Content: Thought content normal.        Judgment: Judgment normal.      Assessment & Plan    1. Chronic interstitial cystitis Recent increase in personal stress causing flare of interstitial cystitis. She has missed work for past several days. Will complete FMLA paperwork when received to cover the time she has missed so she does not receive an occurrence at work.   2. Chronic fatigue Fatigue significant symptom associated with IC flare. Labs to be checked for further evaluation.   3. Body mass index (BMI) of 40.1-44.9 in adult [facility]) Discussed lowering calorie intake to 1500 calories per day and incorporating exercise into daily routine to help lose weight.    Return for as scheduled - .        [facility], NP  [facility] at [facility] [phone] (phone) [phone] (fax)  [facility]

## 2021-08-30 ENCOUNTER — Ambulatory Visit (INDEPENDENT_AMBULATORY_CARE_PROVIDER_SITE_OTHER): Payer: BC Managed Care – PPO | Admitting: Nurse Practitioner

## 2021-08-30 ENCOUNTER — Other Ambulatory Visit: Payer: BC Managed Care – PPO

## 2021-08-30 DIAGNOSIS — E039 Hypothyroidism, unspecified: Secondary | ICD-10-CM | POA: Diagnosis not present

## 2021-08-30 DIAGNOSIS — Z0000 Encounter for general adult medical examination without abnormal findings: Secondary | ICD-10-CM | POA: Diagnosis not present

## 2021-08-30 DIAGNOSIS — R5382 Chronic fatigue, unspecified: Secondary | ICD-10-CM

## 2021-08-30 DIAGNOSIS — E569 Vitamin deficiency, unspecified: Secondary | ICD-10-CM

## 2021-08-30 NOTE — Progress Notes (Signed)
Patient asked to have appointment changed from follow up to lab draw only,.

## 2021-08-30 NOTE — Progress Notes (Deleted)
Established patient visit   Patient: Anna Burke   DOB: 11-Jul-1980   41 y.o. Female  MRN: [id number] Visit Date: 08/30/2021   No chief complaint on file.  Subjective    HPI  Follow up - deu to have labs done due to chronic fatigue. Has not had these done up to this point.  -interstitial cystitis.  -shift work disorder  -chronic fatigue  Medications: Outpatient Medications Prior to Visit  Medication Sig   albuterol (VENTOLIN HFA) 108 (90 Base) MCG/ACT inhaler Inhale 2 puffs into the lungs every 6 (six) hours as needed for wheezing or shortness of breath.   buPROPion (WELLBUTRIN XL) 300 MG 24 hr tablet Take 1 tablet (300 mg total) by mouth daily.   diazepam (VALIUM) 5 MG tablet TAKE 1 TABLET DAILY AS NEEDED   esomeprazole (NEXIUM) 40 MG capsule TAKE 1 CAPSULE DAILY   famotidine (PEPCID) 40 MG tablet Take 1 tablet (40 mg total) by mouth daily.   hydrOXYzine (ATARAX/VISTARIL) 25 MG tablet Take 1 tablet (25 mg total) by mouth every 8 (eight) hours as needed.   lidocaine (XYLOCAINE) 2 % solution Use as directed 15 mLs in the mouth or throat every 6 (six) hours as needed for mouth pain.   lisinopril (ZESTRIL) 5 MG tablet TAKE 1 TABLET DAILY   modafinil (PROVIGIL) 100 MG tablet Take 1 tablet (100 mg total) by mouth daily.   montelukast (SINGULAIR) 10 MG tablet TAKE 1 TABLET BY MOUTH EVERYDAY AT BEDTIME   norethindrone-ethinyl estradiol-FE (BLISOVI FE 1/20) 1-20 MG-MCG tablet Take 1 tablet by mouth daily.   rizatriptan (MAXALT) 10 MG tablet Take 1 tablet (10 mg total) by mouth as needed for migraine. May repeat in 2 hours if needed   No facility-administered medications prior to visit.    Review of Systems  {Labs (Optional):23779}   Objective    There were no vitals taken for this visit. BP Readings from Last 3 Encounters:  08/24/21 136/84  07/19/21 135/84  06/20/21 139/82    Wt Readings from Last 3 Encounters:  08/24/21 236 lb 1.9 oz (107.1 kg)  07/19/21 235 lb 9.6 oz  (106.9 kg)  06/20/21 236 lb 14.4 oz (107.5 kg)    Physical Exam  ***  No results found for any visits on 08/30/21.  Assessment & Plan     Problem List Items Addressed This Visit   None    No follow-ups on file.         Ronnell Freshwater, NP  [facility] at [facility] [phone] (phone) [phone] (fax)  [place]

## 2021-09-06 ENCOUNTER — Encounter: Payer: Self-pay | Admitting: Nurse Practitioner

## 2021-09-17 NOTE — Telephone Encounter (Signed)
These were completed

## 2021-09-19 ENCOUNTER — Other Ambulatory Visit: Payer: Self-pay | Admitting: Nurse Practitioner

## 2021-09-19 DIAGNOSIS — J4531 Mild persistent asthma with (acute) exacerbation: Secondary | ICD-10-CM

## 2021-10-02 ENCOUNTER — Encounter: Payer: Self-pay | Admitting: Nurse Practitioner

## 2021-10-02 ENCOUNTER — Other Ambulatory Visit: Payer: Self-pay

## 2021-10-02 ENCOUNTER — Ambulatory Visit (INDEPENDENT_AMBULATORY_CARE_PROVIDER_SITE_OTHER): Payer: BC Managed Care – PPO | Admitting: Nurse Practitioner

## 2021-10-02 VITALS — BP 124/88 | HR 110 | Temp 98.0°F | Ht 64.17 in | Wt 234.0 lb

## 2021-10-02 DIAGNOSIS — E039 Hypothyroidism, unspecified: Secondary | ICD-10-CM

## 2021-10-02 DIAGNOSIS — G4726 Circadian rhythm sleep disorder, shift work type: Secondary | ICD-10-CM

## 2021-10-02 DIAGNOSIS — G43109 Migraine with aura, not intractable, without status migrainosus: Secondary | ICD-10-CM | POA: Diagnosis not present

## 2021-10-02 DIAGNOSIS — R5383 Other fatigue: Secondary | ICD-10-CM

## 2021-10-02 DIAGNOSIS — R5382 Chronic fatigue, unspecified: Secondary | ICD-10-CM | POA: Diagnosis not present

## 2021-10-02 DIAGNOSIS — N3010 Interstitial cystitis (chronic) without hematuria: Secondary | ICD-10-CM | POA: Diagnosis not present

## 2021-10-02 DIAGNOSIS — E569 Vitamin deficiency, unspecified: Secondary | ICD-10-CM

## 2021-10-02 DIAGNOSIS — Z0000 Encounter for general adult medical examination without abnormal findings: Secondary | ICD-10-CM

## 2021-10-02 MED ORDER — ELETRIPTAN HYDROBROMIDE 20 MG PO TABS: 20.0000 mg | ORAL_TABLET | ORAL | 1 refills | Status: DC | PRN

## 2021-10-02 NOTE — Progress Notes (Signed)
Established patient visit   Patient: Anna Burke   DOB: 1980-12-20   40 y.o. Female  MRN: [id number] Visit Date: 10/02/2021  Chief Complaint  Patient presents with   Migraine   Subjective    HPI  New onset migraines  -started about 2 weeks ago.  -started with nausea and vomiting.  -had what sounds like aura prior to getting headache.  -headache last for about 1.5 days.  -able to take Maxalt which improved symptoms.  -missed three days of work due to this most recent headache.  -would like to have FMLA paperwork completed and returned to cover her days out of work due to migraine headache.    Medications: Outpatient Medications Prior to Visit  Medication Sig   albuterol (VENTOLIN HFA) 108 (90 Base) MCG/ACT inhaler Inhale 2 puffs into the lungs every 6 (six) hours as needed for wheezing or shortness of breath.   buPROPion (WELLBUTRIN XL) 300 MG 24 hr tablet Take 1 tablet (300 mg total) by mouth daily.   diazepam (VALIUM) 5 MG tablet TAKE 1 TABLET DAILY AS NEEDED   esomeprazole (NEXIUM) 40 MG capsule TAKE 1 CAPSULE DAILY   famotidine (PEPCID) 40 MG tablet Take 1 tablet (40 mg total) by mouth daily.   hydrOXYzine (ATARAX/VISTARIL) 25 MG tablet Take 1 tablet (25 mg total) by mouth every 8 (eight) hours as needed.   lidocaine (XYLOCAINE) 2 % solution Use as directed 15 mLs in the mouth or throat every 6 (six) hours as needed for mouth pain.   lisinopril (ZESTRIL) 5 MG tablet TAKE 1 TABLET DAILY   modafinil (PROVIGIL) 100 MG tablet Take 1 tablet (100 mg total) by mouth daily.   montelukast (SINGULAIR) 10 MG tablet TAKE 1 TABLET BY MOUTH EVERYDAY AT BEDTIME   norethindrone-ethinyl estradiol-FE (BLISOVI FE 1/20) 1-20 MG-MCG tablet Take 1 tablet by mouth daily.   [DISCONTINUED] rizatriptan (MAXALT) 10 MG tablet Take 1 tablet (10 mg total) by mouth as needed for migraine. May repeat in 2 hours if needed   No facility-administered medications prior to visit.    Review of Systems   Constitutional:  Positive for fatigue. Negative for activity change, appetite change, chills and fever.       Missed work for 3 days due to migraine headache which caused sudden bouts of nausea and vomiting.   HENT:  Negative for congestion, postnasal drip, rhinorrhea, sinus pressure, sinus pain, sneezing and sore throat.   Eyes: Negative.   Respiratory:  Negative for cough, chest tightness, shortness of breath and wheezing.   Cardiovascular:  Negative for chest pain and palpitations.  Gastrointestinal:  Positive for nausea and vomiting. Negative for abdominal pain, constipation and diarrhea.  Endocrine: Negative for cold intolerance, heat intolerance, polydipsia and polyuria.  Genitourinary:  Negative for dyspareunia, dysuria, flank pain, frequency and urgency.  Musculoskeletal:  Positive for arthralgias and myalgias. Negative for back pain.  Skin:  Negative for rash.  Allergic/Immunologic: Positive for environmental allergies.  Neurological:  Positive for headaches. Negative for dizziness and weakness.  Hematological:  Negative for adenopathy.  Psychiatric/Behavioral:  Positive for dysphoric mood. The patient is nervous/anxious.      Objective     Today's Vitals   10/02/21 1619  BP: 124/88  Pulse: (!) 110  Temp: 98 F (36.7 C)  SpO2: 99%  Weight: 234 lb (106.1 kg)  Height: 5' 4.17" (1.63 m)   Body mass index is 39.95 kg/m.   Physical Exam Vitals and nursing note reviewed.  Constitutional:  Appearance: Normal appearance. She is well-developed. She is obese.  HENT:     Head: Normocephalic and atraumatic.     Nose: Nose normal.     Mouth/Throat:     Mouth: Mucous membranes are moist.     Pharynx: Oropharynx is clear.  Eyes:     Extraocular Movements: Extraocular movements intact.     Conjunctiva/sclera: Conjunctivae normal.     Pupils: Pupils are equal, round, and reactive to light.  Cardiovascular:     Rate and Rhythm: Normal rate and regular rhythm.     Pulses:  Normal pulses.     Heart sounds: Normal heart sounds.  Pulmonary:     Effort: Pulmonary effort is normal.     Breath sounds: Normal breath sounds.  Abdominal:     Palpations: Abdomen is soft.  Musculoskeletal:        General: Normal range of motion.     Cervical back: Normal range of motion and neck supple.  Lymphadenopathy:     Cervical: No cervical adenopathy.  Skin:    General: Skin is warm and dry.     Capillary Refill: Capillary refill takes less than 2 seconds.  Neurological:     General: No focal deficit present.     Mental Status: She is alert and oriented to person, place, and time.  Psychiatric:        Mood and Affect: Mood normal.        Behavior: Behavior normal.        Thought Content: Thought content normal.        Judgment: Judgment normal.      Assessment & Plan     1. Migraine with aura and without status migrainosus, not intractable Change maxalt to relpax 20 mg as needed for migraine headaches. Refer patient to neurology for further evaluation of migraine headaches.  - Ambulatory referral to Neurology - eletriptan (RELPAX) 20 MG tablet; Take 1 tablet (20 mg total) by mouth as needed for migraine or headache. May repeat in 2 hours if headache persists or recurs.  Dispense: 30 tablet; Refill: 1  2. Shift work sleep disorder Referral made to neurology for further evaluation of shift work disorder.  - Ambulatory referral to Neurology  3. Interstitial cystitis Referral to neurology for further evaluation of chronic interstitial cystitis.   4. Chronic fatigue Labs orders placed for anemia panel, vitamin d, and routine, fasting labs. Patient will need to have labs done for further evaluation.    Return for prn worsening or persistent symptoms.        Carlean Jews, NP  [facility] at [facility] [phone] (phone) [phone] (fax)  [facility]

## 2021-10-03 ENCOUNTER — Other Ambulatory Visit: Payer: BC Managed Care – PPO

## 2021-10-08 DIAGNOSIS — G43109 Migraine with aura, not intractable, without status migrainosus: Secondary | ICD-10-CM | POA: Insufficient documentation

## 2021-11-11 ENCOUNTER — Other Ambulatory Visit: Payer: Self-pay | Admitting: Physician Assistant

## 2021-11-11 DIAGNOSIS — K219 Gastro-esophageal reflux disease without esophagitis: Secondary | ICD-10-CM

## 2021-12-21 ENCOUNTER — Encounter: Payer: Self-pay | Admitting: Nurse Practitioner

## 2021-12-21 ENCOUNTER — Ambulatory Visit (INDEPENDENT_AMBULATORY_CARE_PROVIDER_SITE_OTHER): Payer: BC Managed Care – PPO | Admitting: Nurse Practitioner

## 2021-12-21 VITALS — BP 138/88 | HR 114 | Ht 64.17 in | Wt 230.8 lb

## 2021-12-21 DIAGNOSIS — R051 Acute cough: Secondary | ICD-10-CM | POA: Diagnosis not present

## 2021-12-21 DIAGNOSIS — J0140 Acute pansinusitis, unspecified: Secondary | ICD-10-CM | POA: Diagnosis not present

## 2021-12-21 MED ORDER — HYDROCOD POLI-CHLORPHE POLI ER 10-8 MG/5ML PO SUER: 5.0000 mL | Freq: Two times a day (BID) | ORAL | 0 refills | Status: DC | PRN

## 2021-12-21 MED ORDER — AZITHROMYCIN 250 MG PO TABS: ORAL_TABLET | ORAL | 0 refills | Status: DC

## 2021-12-21 MED ORDER — METHYLPREDNISOLONE 4 MG PO TBPK: ORAL_TABLET | ORAL | 0 refills | Status: DC

## 2021-12-21 NOTE — Progress Notes (Signed)
Established patient visit   Patient: Anna Burke   DOB: 12/31/1980   41 y.o. Female  MRN: [id number] Visit Date: 12/21/2021  Chief Complaint  Patient presents with   Cough   Subjective    Cough Associated symptoms include chills, headaches, postnasal drip, rhinorrhea, a sore throat and wheezing. Pertinent negatives include no chest pain, fever, myalgias, rash or shortness of breath. Her past medical history is significant for environmental allergies.    Acute visit  -two weeks ago.had fatigue, body aches, cough, sore throat.  -first positive COVID test was 12/12/2021  -now having cough, sore throat, sinus congestion.  -taste and smell are not intact.  -has been taking expectorant for cough which has not been helping    Medications: Outpatient Medications Prior to Visit  Medication Sig   albuterol (VENTOLIN HFA) 108 (90 Base) MCG/ACT inhaler Inhale 2 puffs into the lungs every 6 (six) hours as needed for wheezing or shortness of breath.   buPROPion (WELLBUTRIN XL) 300 MG 24 hr tablet Take 1 tablet (300 mg total) by mouth daily.   diazepam (VALIUM) 5 MG tablet TAKE 1 TABLET DAILY AS NEEDED   eletriptan (RELPAX) 20 MG tablet Take 1 tablet (20 mg total) by mouth as needed for migraine or headache. May repeat in 2 hours if headache persists or recurs.   esomeprazole (NEXIUM) 40 MG capsule TAKE 1 CAPSULE DAILY   famotidine (PEPCID) 40 MG tablet Take 1 tablet (40 mg total) by mouth daily.   hydrOXYzine (ATARAX/VISTARIL) 25 MG tablet Take 1 tablet (25 mg total) by mouth every 8 (eight) hours as needed.   lidocaine (XYLOCAINE) 2 % solution Use as directed 15 mLs in the mouth or throat every 6 (six) hours as needed for mouth pain.   lisinopril (ZESTRIL) 5 MG tablet TAKE 1 TABLET DAILY   modafinil (PROVIGIL) 100 MG tablet Take 1 tablet (100 mg total) by mouth daily.   montelukast (SINGULAIR) 10 MG tablet TAKE 1 TABLET BY MOUTH EVERYDAY AT BEDTIME   norethindrone-ethinyl estradiol-FE  (BLISOVI FE 1/20) 1-20 MG-MCG tablet Take 1 tablet by mouth daily.   No facility-administered medications prior to visit.    Review of Systems  Constitutional:  Positive for appetite change, chills, diaphoresis and fatigue. Negative for activity change and fever.  HENT:  Positive for congestion, postnasal drip, rhinorrhea, sinus pressure, sinus pain and sore throat. Negative for sneezing.   Eyes: Negative.   Respiratory:  Positive for cough and wheezing. Negative for chest tightness and shortness of breath.   Cardiovascular:  Negative for chest pain and palpitations.  Gastrointestinal:  Negative for abdominal pain, constipation, diarrhea, nausea and vomiting.  Endocrine: Negative for cold intolerance, heat intolerance, polydipsia and polyuria.  Genitourinary:  Negative for dyspareunia, dysuria, flank pain, frequency and urgency.  Musculoskeletal:  Negative for arthralgias, back pain and myalgias.  Skin:  Negative for rash.  Allergic/Immunologic: Positive for environmental allergies.  Neurological:  Positive for headaches. Negative for dizziness and weakness.  Hematological:  Negative for adenopathy.  Psychiatric/Behavioral:  The patient is not nervous/anxious.      Objective     Today's Vitals   12/21/21 1502  BP: 138/88  Pulse: (Abnormal) 114  SpO2: 97%  Weight: 230 lb 12.8 oz (104.7 kg)  Height: 5' 4.17" (1.63 m)   Body mass index is 39.41 kg/m.   Physical Exam Vitals and nursing note reviewed.  Constitutional:      Appearance: Normal appearance. She is well-developed. She is obese. She is ill-appearing.  HENT:     Head: Normocephalic and atraumatic.     Right Ear: Tympanic membrane is bulging.     Left Ear: Tympanic membrane is bulging.     Nose: Congestion present.     Right Turbinates: Swollen.     Left Turbinates: Swollen.     Right Sinus: Maxillary sinus tenderness and frontal sinus tenderness present.     Left Sinus: Maxillary sinus tenderness and frontal sinus  tenderness present.  Eyes:     Pupils: Pupils are equal, round, and reactive to light.  Cardiovascular:     Rate and Rhythm: Normal rate and regular rhythm.     Pulses: Normal pulses.     Heart sounds: Normal heart sounds.  Pulmonary:     Effort: Pulmonary effort is normal.     Breath sounds: Normal breath sounds.  Abdominal:     Palpations: Abdomen is soft.  Musculoskeletal:        General: Normal range of motion.     Cervical back: Normal range of motion and neck supple.  Lymphadenopathy:     Cervical: No cervical adenopathy.  Skin:    General: Skin is warm and dry.     Capillary Refill: Capillary refill takes less than 2 seconds.  Neurological:     General: No focal deficit present.     Mental Status: She is alert and oriented to person, place, and time.  Psychiatric:        Mood and Affect: Mood normal.        Behavior: Behavior normal.        Thought Content: Thought content normal.        Judgment: Judgment normal.       Assessment & Plan     1. Acute cough Start Medrol taper.  Take as directed for 6 days.  New prescription for Tussionex cough suppressant.  This may be taken twice daily as needed for cough.  Advise she use with caution as this medication can cause dizziness and drowsiness.  She should not drive, work, or make legal decisions after taking this medication.  She voiced understanding and agreement. - methylPREDNISolone (MEDROL) 4 MG TBPK tablet; Take by mouth as directed for 6 days  Dispense: 21 tablet; Refill: 0 - chlorpheniramine-HYDROcodone (TUSSIONEX) 10-8 MG/5ML; Take 5 mLs by mouth every 12 (twelve) hours as needed for cough.  Dispense: 115 mL; Refill: 0  2. Acute non-recurrent pansinusitis Start Z-Pak.  Take as directed for 5 days. Rest and increase fluids. Continue using OTC medication to control symptoms.   - azithromycin (ZITHROMAX) 250 MG tablet; z-pack - take as directed for 5 days  Dispense: 6 tablet; Refill: 0   Return for prn worsening or  persistent symptoms.        Carlean Jews, NP  [facility] [phone] (phone) [phone] (fax)  [facility]

## 2022-01-07 ENCOUNTER — Other Ambulatory Visit: Payer: Self-pay | Admitting: Nurse Practitioner

## 2022-01-07 DIAGNOSIS — J0140 Acute pansinusitis, unspecified: Secondary | ICD-10-CM | POA: Insufficient documentation

## 2022-01-07 DIAGNOSIS — R030 Elevated blood-pressure reading, without diagnosis of hypertension: Secondary | ICD-10-CM

## 2022-02-01 ENCOUNTER — Other Ambulatory Visit: Payer: Self-pay | Admitting: Nurse Practitioner

## 2022-02-01 DIAGNOSIS — J4531 Mild persistent asthma with (acute) exacerbation: Secondary | ICD-10-CM

## 2022-02-09 ENCOUNTER — Other Ambulatory Visit: Payer: Self-pay | Admitting: Nurse Practitioner

## 2022-02-09 DIAGNOSIS — F419 Anxiety disorder, unspecified: Secondary | ICD-10-CM

## 2022-02-09 DIAGNOSIS — K219 Gastro-esophageal reflux disease without esophagitis: Secondary | ICD-10-CM

## 2022-03-01 ENCOUNTER — Other Ambulatory Visit: Payer: Self-pay | Admitting: Nurse Practitioner

## 2022-03-01 DIAGNOSIS — Z3041 Encounter for surveillance of contraceptive pills: Secondary | ICD-10-CM

## 2022-05-24 ENCOUNTER — Other Ambulatory Visit: Payer: Self-pay | Admitting: Nurse Practitioner

## 2022-05-24 DIAGNOSIS — Z3041 Encounter for surveillance of contraceptive pills: Secondary | ICD-10-CM

## 2022-05-28 ENCOUNTER — Telehealth: Payer: BC Managed Care – PPO

## 2022-06-29 ENCOUNTER — Other Ambulatory Visit: Payer: Self-pay | Admitting: Nurse Practitioner

## 2022-06-29 DIAGNOSIS — R030 Elevated blood-pressure reading, without diagnosis of hypertension: Secondary | ICD-10-CM

## 2022-08-16 ENCOUNTER — Other Ambulatory Visit: Payer: Self-pay | Admitting: Nurse Practitioner

## 2022-08-16 DIAGNOSIS — Z3041 Encounter for surveillance of contraceptive pills: Secondary | ICD-10-CM

## 2022-08-31 ENCOUNTER — Other Ambulatory Visit: Payer: Self-pay | Admitting: Nurse Practitioner

## 2022-08-31 DIAGNOSIS — G43009 Migraine without aura, not intractable, without status migrainosus: Secondary | ICD-10-CM

## 2023-01-18 ENCOUNTER — Other Ambulatory Visit: Payer: Self-pay | Admitting: Nurse Practitioner

## 2023-01-18 DIAGNOSIS — R030 Elevated blood-pressure reading, without diagnosis of hypertension: Secondary | ICD-10-CM

## 2023-03-21 ENCOUNTER — Other Ambulatory Visit: Payer: Self-pay | Admitting: Nurse Practitioner

## 2023-03-21 DIAGNOSIS — K219 Gastro-esophageal reflux disease without esophagitis: Secondary | ICD-10-CM

## 2023-03-21 NOTE — Telephone Encounter (Signed)
This patient used to see me for primary, but not in a long time. I'm not sure who she would want to see, but I'm sure she needs a visit.  Herbert Seta

## 2023-03-22 NOTE — Telephone Encounter (Signed)
LVM for pt to call office to see about scheduling an appointment and then will see about getting the refill sent in for the requested medication. Also will send mychart message.

## 2023-04-16 NOTE — Telephone Encounter (Signed)
 Contacted pt again and LVM asking her to schedule an appointment and then we can see about getting medication refill sent to last till that appt.

## 2023-05-02 ENCOUNTER — Other Ambulatory Visit: Payer: Self-pay | Admitting: Family Medicine

## 2023-05-02 DIAGNOSIS — R030 Elevated blood-pressure reading, without diagnosis of hypertension: Secondary | ICD-10-CM

## 2023-05-02 DIAGNOSIS — F419 Anxiety disorder, unspecified: Secondary | ICD-10-CM

## 2023-05-02 NOTE — Telephone Encounter (Signed)
Copied from CRM [id number]. Topic: Clinical - Medication Refill >> May 02, 2023  4:32 PM Shelah Lewandowsky wrote: Most Recent Primary Care Visit:  Provider: Carlean Jews  Department: [facility]  Visit Type: ACUTE  Date: 12/21/2021  Medication: lisinopril (ZESTRIL) 5 MG tablet   Has the patient contacted their pharmacy? Yes (Agent: If no, request that the patient contact the pharmacy for the refill. If patient does not wish to contact the pharmacy document the reason why and proceed with request.) (Agent: If yes, when and what did the pharmacy advise?)  Is this the correct pharmacy for this prescription? Yes If no, delete pharmacy and type the correct one.  This is the patient's preferred pharmacy:    [facility] [address] [place] - [address] [place] [address] Phone: [phone] Fax: [phone]   Has the prescription been filled recently? No  Is the patient out of the medication? Yes  Has the patient been seen for an appointment in the last year OR does the patient have an upcoming appointment? Yes  Can we respond through MyChart? Yes  Agent: Please be advised that Rx refills may take up to 3 business days. We ask that you follow-up with your pharmacy.

## 2023-05-02 NOTE — Telephone Encounter (Signed)
Last Fill: Lisinopril: 01/08/22     Atarax:01/25/21 Last OV: 09/02/20 Next OV: 05/22/23  Routing to provider for review/authorization.

## 2023-05-03 MED ORDER — LISINOPRIL 5 MG PO TABS: 5.0000 mg | ORAL_TABLET | Freq: Every day | ORAL | 0 refills | Status: DC

## 2023-05-03 MED ORDER — HYDROXYZINE HCL 25 MG PO TABS: 25.0000 mg | ORAL_TABLET | Freq: Three times a day (TID) | ORAL | 0 refills | Status: DC | PRN

## 2023-05-20 ENCOUNTER — Other Ambulatory Visit: Payer: Self-pay | Admitting: Family Medicine

## 2023-05-20 DIAGNOSIS — R030 Elevated blood-pressure reading, without diagnosis of hypertension: Secondary | ICD-10-CM

## 2023-05-22 ENCOUNTER — Ambulatory Visit: Payer: BC Managed Care – PPO | Admitting: Family Medicine

## 2023-05-22 VITALS — BP 117/75 | HR 92 | Ht 64.17 in | Wt 223.4 lb

## 2023-05-22 DIAGNOSIS — F419 Anxiety disorder, unspecified: Secondary | ICD-10-CM | POA: Diagnosis not present

## 2023-05-22 DIAGNOSIS — I10 Essential (primary) hypertension: Secondary | ICD-10-CM | POA: Diagnosis not present

## 2023-05-22 DIAGNOSIS — G4726 Circadian rhythm sleep disorder, shift work type: Secondary | ICD-10-CM

## 2023-05-22 DIAGNOSIS — Z8249 Family history of ischemic heart disease and other diseases of the circulatory system: Secondary | ICD-10-CM

## 2023-05-22 DIAGNOSIS — K219 Gastro-esophageal reflux disease without esophagitis: Secondary | ICD-10-CM

## 2023-05-22 DIAGNOSIS — R5382 Chronic fatigue, unspecified: Secondary | ICD-10-CM | POA: Diagnosis not present

## 2023-05-22 DIAGNOSIS — G43009 Migraine without aura, not intractable, without status migrainosus: Secondary | ICD-10-CM

## 2023-05-22 DIAGNOSIS — N3010 Interstitial cystitis (chronic) without hematuria: Secondary | ICD-10-CM

## 2023-05-22 DIAGNOSIS — L659 Nonscarring hair loss, unspecified: Secondary | ICD-10-CM

## 2023-05-22 DIAGNOSIS — Z6841 Body Mass Index (BMI) 40.0 and over, adult: Secondary | ICD-10-CM

## 2023-05-22 MED ORDER — BUPROPION HCL ER (XL) 300 MG PO TB24: 300.0000 mg | ORAL_TABLET | Freq: Every day | ORAL | 3 refills | Status: AC

## 2023-05-22 MED ORDER — RIZATRIPTAN BENZOATE 10 MG PO TABS: 10.0000 mg | ORAL_TABLET | ORAL | 3 refills | Status: DC | PRN

## 2023-05-22 MED ORDER — LISINOPRIL 5 MG PO TABS: 5.0000 mg | ORAL_TABLET | Freq: Every day | ORAL | 3 refills | Status: DC

## 2023-05-22 MED ORDER — ESOMEPRAZOLE MAGNESIUM 40 MG PO CPDR: 40.0000 mg | DELAYED_RELEASE_CAPSULE | Freq: Every day | ORAL | 3 refills | Status: DC

## 2023-05-22 MED ORDER — BUPROPION HCL ER (XL) 150 MG PO TB24: 150.0000 mg | ORAL_TABLET | Freq: Every day | ORAL | 0 refills | Status: DC

## 2023-05-22 MED ORDER — HYDROXYZINE HCL 25 MG PO TABS: 25.0000 mg | ORAL_TABLET | Freq: Three times a day (TID) | ORAL | 2 refills | Status: DC | PRN

## 2023-05-22 MED ORDER — MODAFINIL 100 MG PO TABS
100.0000 mg | ORAL_TABLET | Freq: Every day | ORAL | 0 refills | Status: DC
Start: 2023-05-22 — End: 2023-08-15

## 2023-05-22 NOTE — Assessment & Plan Note (Signed)
Restarting bupropion at 150 mg and increasing to 300 after 1 month.  Follow-up in 1 month.

## 2023-05-22 NOTE — Patient Instructions (Addendum)
It was nice to see you today,  We addressed the following topics today: -I have sent in refills of your medication. - For your migraines I have sent in rizatriptan. - To prevent migraines you can try taking 400 mg of magnesium and 400 mg of riboflavin over-the-counter daily.  The prescription options we can discuss at your next visit are Qulipta and propranolol.  Your bupropion might help with migraines - I will send in a prescription for 1 month of the 150 mg of bupropion and then after that will be 300 mg - For hair loss, you can take biotin or B vitamin complex over-the-counter.  Rogaine or topical minoxidil is also available over-the-counter. - For your wrist and forearm numbness, you can try using an elbow splint at night.  These are to help with cubital tunnel syndrome.  There is a picture of what these below. - If you would like to see a podiatrist about the numbness in your pinky toe let us know and we can send in referral.     Have a great day,  Frederic Jericho, MD

## 2023-05-22 NOTE — Assessment & Plan Note (Signed)
Patient takes hydroxyzine as needed for interstitial cystitis.  Patient request refill.  Refill sent in.

## 2023-05-22 NOTE — Progress Notes (Signed)
Established Patient Office Visit  Subjective   Patient ID: Anna Burke, female    DOB: 22-Aug-1980  Age: 43 y.o. MRN: [id number]  Chief Complaint  Patient presents with   Medication Refill    HPI Patient here today to restart some of her medications that have lapsed since she has not seen a provider since 2023.  Has been busy taking care of her father in [place] who has been sick.  Patient is still taking her lisinopril daily as well as her omeprazole.  She will occasionally take her hydroxyzine for interstitial cystitis.  Otherwise she has not taken her other prescribed medications in several months.  Patient takes Provigil for  Patient does not take amitriptyline.  It did not help when she took it.  She did say that rizatriptan was more effective for migraines.  Currently she is taking over-the-counter treatments for migraines.  Patient was previously on oral contraceptives.  Recently stopped within the past few years.  Her menstrual cycles resumed but over the last year they have become less frequent.  She has not had a period since November of last year.  She says she is looking for a obstetrician/gynecologist but does not want [place] to send in a referral at this time.  Patient states her mother had an undiagnosed heart disease that led to her having an MI and stroke during surgery.  She subsequently passed away.  States her father has heart disease as well.  Patient was concerned about her own cardiovascular health.  We discussed workup of cardiovascular disease.  Patient complains of bilateral periodic numbness and paresthesia in the forearms and wrists.  She works on the computer several hours during the day.  Patient also complains of numbness in her fifth toe on the left foot.  It is not painful.  Feels numb all the time she states.  Has been ongoing for at least a few months.  Generally wears flip-flops or barefoot at home.  Patient would like to restart her bupropion.  We  discussed starting with 150 mg and increasing to 300 which was what she was on prior to stopping.  The ASCVD Risk score (Arnett DK, et al., 2019) failed to calculate for the following reasons:   Cannot find a previous HDL lab   Cannot find a previous total cholesterol lab  Health Maintenance Due  Topic Date Due   Pneumococcal Vaccine 37-7 Years old (1 of 2 - PCV) Never done   Hepatitis C Screening  Never done   Cervical Cancer Screening (HPV/Pap Cotest)  03/29/2018   INFLUENZA VACCINE  Never done   COVID-19 Vaccine (4 - 2024-25 season) 12/09/2022      Objective:     BP 117/75   Pulse 92   Ht 5' 4.17" (1.63 m)   Wt 223 lb 6.4 oz (101.3 kg)   SpO2 99%   BMI 38.14 kg/m    Physical Exam General: Alert, oriented Pulmonary: No respiratory stress Extremities: No abnormalities seen in the left foot.  The MTP joint on the fifth toe has thickened callus skin overlying it.  Sensation is intact to pinprick.   No results found for any visits on 05/22/23.      Assessment & Plan:   Essential hypertension Assessment & Plan: Continue lisinopril.  Check CMP today.  Orders: -     Comprehensive metabolic panel; Future -     Lisinopril; Take 1 tablet (5 mg total) by mouth daily.  Dispense: 90 tablet; Refill: 3  Anxiety Assessment & Plan: Restarting bupropion at 150 mg and increasing to 300 after 1 month.  Follow-up in 1 month.  Orders: -     buPROPion HCl ER (XL); Take 1 tablet (300 mg total) by mouth daily.  Dispense: 90 tablet; Refill: 3 -     buPROPion HCl ER (XL); Take 1 tablet (150 mg total) by mouth daily.  Dispense: 30 tablet; Refill: 0  Gastroesophageal reflux disease without esophagitis -     Esomeprazole Magnesium; Take 1 capsule (40 mg total) by mouth daily.  Dispense: 90 capsule; Refill: 3  Chronic fatigue -     TSH; Future -     CBC; Future -     Modafinil; Take 1 tablet (100 mg total) by mouth daily.  Dispense: 90 tablet; Refill: 0  Shift work sleep  disorder -     Modafinil; Take 1 tablet (100 mg total) by mouth daily.  Dispense: 90 tablet; Refill: 0  Family history of atherosclerosis -     Lipid panel; Future -     Lipoprotein A (LPA); Future -     Apolipoprotein B; Future  Body mass index (BMI) of 40.1-44.9 in adult (HCC) -     Hemoglobin A1c; Future -     Lipid panel; Future -     Lipoprotein A (LPA); Future -     Apolipoprotein B; Future  Chronic interstitial cystitis Assessment & Plan: Patient takes hydroxyzine as needed for interstitial cystitis.  Patient request refill.  Refill sent in.  Orders: -     hydrOXYzine HCl; Take 1 tablet (25 mg total) by mouth every 8 (eight) hours as needed.  Dispense: 30 tablet; Refill: 2  Migraine without aura and without status migrainosus, not intractable Assessment & Plan: Sent in prescription for rizatriptan.  Eletriptan was not effective.  If rizatriptan not effective can send in prescription for Nurtec.  Also talked to patient on preventative treatment such as propranolol or Qulipta.  Restarting bupropion may help migraines.  Will reassess in 1 month    Hair thinning Assessment & Plan: Recommended topical minoxidil and over-the-counter biotin supplementation.  Oral finasteride generally not effective in female pattern hair loss.  Offered referral to dermatology.  Will check TSH   Other orders -     Rizatriptan Benzoate; Take 1 tablet (10 mg total) by mouth as needed for migraine. May repeat in 2 hours if needed  Dispense: 10 tablet; Refill: 3     Return in about 4 weeks (around 06/19/2023) for HTN, migraine.    Sandre Kitty, MD

## 2023-05-22 NOTE — Assessment & Plan Note (Signed)
Recommended topical minoxidil and over-the-counter biotin supplementation.  Oral finasteride generally not effective in female pattern hair loss.  Offered referral to dermatology.  Will check TSH

## 2023-05-22 NOTE — Assessment & Plan Note (Signed)
Continue lisinopril.  Check CMP today.

## 2023-05-22 NOTE — Assessment & Plan Note (Signed)
Sent in prescription for rizatriptan.  Eletriptan was not effective.  If rizatriptan not effective can send in prescription for Nurtec.  Also talked to patient on preventative treatment such as propranolol or Qulipta.  Restarting bupropion may help migraines.  Will reassess in 1 month

## 2023-06-05 ENCOUNTER — Other Ambulatory Visit: Payer: BC Managed Care – PPO

## 2023-06-05 DIAGNOSIS — Z6841 Body Mass Index (BMI) 40.0 and over, adult: Secondary | ICD-10-CM

## 2023-06-05 DIAGNOSIS — Z8249 Family history of ischemic heart disease and other diseases of the circulatory system: Secondary | ICD-10-CM

## 2023-06-05 DIAGNOSIS — I10 Essential (primary) hypertension: Secondary | ICD-10-CM

## 2023-06-05 DIAGNOSIS — R5382 Chronic fatigue, unspecified: Secondary | ICD-10-CM

## 2023-06-07 LAB — COMPREHENSIVE METABOLIC PANEL
ALT: 26 [IU]/L (ref 0–32)
AST: 24 [IU]/L (ref 0–40)
Albumin: 4.2 g/dL (ref 3.9–4.9)
Alkaline Phosphatase: 146 [IU]/L — ABNORMAL HIGH (ref 44–121)
BUN/Creatinine Ratio: 12 (ref 9–23)
BUN: 9 mg/dL (ref 6–24)
Bilirubin Total: 0.3 mg/dL (ref 0.0–1.2)
CO2: 22 mmol/L (ref 20–29)
Calcium: 9.5 mg/dL (ref 8.7–10.2)
Chloride: 100 mmol/L (ref 96–106)
Creatinine, Ser: 0.75 mg/dL (ref 0.57–1.00)
Globulin, Total: 2.8 g/dL (ref 1.5–4.5)
Glucose: 87 mg/dL (ref 70–99)
Potassium: 4.4 mmol/L (ref 3.5–5.2)
Sodium: 139 mmol/L (ref 134–144)
Total Protein: 7 g/dL (ref 6.0–8.5)
eGFR: 102 mL/min/{1.73_m2} (ref >59)

## 2023-06-07 LAB — CBC
Hematocrit: 41.2 % (ref 34.0–46.6)
Hemoglobin: 13.6 g/dL (ref 11.1–15.9)
MCH: 29.8 pg (ref 26.6–33.0)
MCHC: 33 g/dL (ref 31.5–35.7)
MCV: 90 fL (ref 79–97)
Platelets: 380 10*3/uL (ref 150–450)
RBC: 4.57 x10E6/uL (ref 3.77–5.28)
RDW: 13.1 % (ref 11.7–15.4)
WBC: 10.2 10*3/uL (ref 3.4–10.8)

## 2023-06-07 LAB — LIPID PANEL
Chol/HDL Ratio: 4.3 {ratio} (ref 0.0–4.4)
Cholesterol, Total: 233 mg/dL — ABNORMAL HIGH (ref 100–199)
HDL: 54 mg/dL (ref >39)
LDL Chol Calc (NIH): 131 mg/dL — ABNORMAL HIGH (ref 0–99)
Triglycerides: 272 mg/dL — ABNORMAL HIGH (ref 0–149)
VLDL Cholesterol Cal: 48 mg/dL — ABNORMAL HIGH (ref 5–40)

## 2023-06-07 LAB — LIPOPROTEIN A (LPA): Lipoprotein (a): 33.5 nmol/L (ref <75.0)

## 2023-06-07 LAB — TSH: TSH: 4.97 u[IU]/mL — ABNORMAL HIGH (ref 0.450–4.500)

## 2023-06-07 LAB — HEMOGLOBIN A1C
Est. average glucose Bld gHb Est-mCnc: 114 mg/dL
Hgb A1c MFr Bld: 5.6 % (ref 4.8–5.6)

## 2023-06-07 LAB — APOLIPOPROTEIN B: Apolipoprotein B: 119 mg/dL — ABNORMAL HIGH (ref <90)

## 2023-06-10 ENCOUNTER — Encounter: Payer: Self-pay | Admitting: Family Medicine

## 2023-07-03 ENCOUNTER — Other Ambulatory Visit: Payer: BC Managed Care – PPO

## 2023-07-03 ENCOUNTER — Encounter: Payer: Self-pay | Admitting: Family Medicine

## 2023-07-03 ENCOUNTER — Ambulatory Visit: Payer: BC Managed Care – PPO | Admitting: Family Medicine

## 2023-07-03 VITALS — BP 129/81 | HR 99 | Ht 64.17 in | Wt 218.4 lb

## 2023-07-03 DIAGNOSIS — G43009 Migraine without aura, not intractable, without status migrainosus: Secondary | ICD-10-CM | POA: Diagnosis not present

## 2023-07-03 DIAGNOSIS — N3010 Interstitial cystitis (chronic) without hematuria: Secondary | ICD-10-CM

## 2023-07-03 DIAGNOSIS — I10 Essential (primary) hypertension: Secondary | ICD-10-CM

## 2023-07-03 DIAGNOSIS — F419 Anxiety disorder, unspecified: Secondary | ICD-10-CM | POA: Diagnosis not present

## 2023-07-03 MED ORDER — DIAZEPAM 5 MG PO TABS: ORAL_TABLET | ORAL | 0 refills | Status: AC

## 2023-07-03 NOTE — Patient Instructions (Addendum)
 It was nice to see you today,  We addressed the following topics today: -I am sending in your prescription of Valium to your mail order pharmacy - If you do not tolerate or do not like the 300 mg dose of bupropion let us know and we can send in a prescription for the lower dose again - Try to limit your saturated fat intake to less than 10 g/day to lower your cholesterol level.  Try to avoid dietary cholesterol and things like eggs. - If you send in your FMLA paperwork I can fill it out for you  Have a great day,  Frederic Jericho, MD

## 2023-07-03 NOTE — Assessment & Plan Note (Signed)
 Blood pressure at goal.  Continue lisinopril

## 2023-07-03 NOTE — Assessment & Plan Note (Signed)
 Continue with bupropion, now starting 300 mg dose.  Can decrease back down to 150 if patient does not tolerate this.

## 2023-07-03 NOTE — Progress Notes (Signed)
   Established Patient Office Visit  Subjective   Patient ID: Anna Burke, female    DOB: 10/12/1980  Age: 43 y.o. MRN: [id number]  Chief Complaint  Patient presents with   Medical Management of Chronic Issues    HPI  HTN  -patient taking her lisinopril.  No issues with this medication.  Discussed her blood pressure goals.  Anxiety-patient taking bupropion.  Just received the 300 mg dose.  Migraines-patient feels like rizatriptan helps when she develops headaches.  Has had to use it less often since I last saw her.  Interstitial cystitis-patient would like a refill of her Valium.  Uses this rarely but recently had to use it and realize she was running low.  Seasonal allergies patient takes a antihistamine.  Discussed second-generation antihistamines with her.  She states she does not get drowsy with antihistamine medications.   The 10-year ASCVD risk score (Arnett DK, et al., 2019) is: 1.2%  Health Maintenance Due  Topic Date Due   Pneumococcal Vaccine 59-65 Years old (1 of 2 - PCV) Never done   Hepatitis C Screening  Never done   Cervical Cancer Screening (HPV/Pap Cotest)  03/29/2018   INFLUENZA VACCINE  Never done   COVID-19 Vaccine (4 - 2024-25 season) 12/09/2022      Objective:     BP 129/81   Pulse 99   Ht 5' 4.17" (1.63 m)   Wt 218 lb 6.4 oz (99.1 kg)   SpO2 97%   BMI 37.29 kg/m    Physical Exam General: Alert, oriented Pulmonary: No respiratory distress Psych: Pleasant affect.   No results found for any visits on 07/03/23.      Assessment & Plan:   Essential hypertension Assessment & Plan: Blood pressure at goal.  Continue lisinopril.   Anxiety Assessment & Plan: Continue with bupropion, now starting 300 mg dose.  Can decrease back down to 150 if patient does not tolerate this.   Chronic interstitial cystitis Assessment & Plan: Valium refill sent in for severe episodes of interstitial cystitis.  Orders: -     diazePAM; TAKE 1  TABLET DAILY AS NEEDED  Dispense: 30 tablet; Refill: 0  Migraine without aura and without status migrainosus, not intractable Assessment & Plan: Takes rizatriptan as needed.  Frequency has improved after starting bupropion.      Return in about 6 months (around 01/03/2024) for mood, hld.    Sandre Kitty, MD

## 2023-07-03 NOTE — Assessment & Plan Note (Signed)
 Takes rizatriptan as needed.  Frequency has improved after starting bupropion.

## 2023-07-03 NOTE — Assessment & Plan Note (Signed)
 Valium refill sent in for severe episodes of interstitial cystitis.

## 2023-08-13 ENCOUNTER — Encounter: Payer: Self-pay | Admitting: Family Medicine

## 2023-08-13 ENCOUNTER — Ambulatory Visit: Admitting: Family Medicine

## 2023-08-13 VITALS — BP 114/76 | HR 64 | Temp 97.6°F | Ht 64.0 in | Wt 219.4 lb

## 2023-08-13 DIAGNOSIS — K089 Disorder of teeth and supporting structures, unspecified: Secondary | ICD-10-CM

## 2023-08-13 DIAGNOSIS — S161XXA Strain of muscle, fascia and tendon at neck level, initial encounter: Secondary | ICD-10-CM | POA: Diagnosis not present

## 2023-08-13 MED ORDER — MELOXICAM 7.5 MG PO TABS
7.5000 mg | ORAL_TABLET | Freq: Every day | ORAL | 0 refills | Status: DC
Start: 2023-08-13 — End: 2023-10-24

## 2023-08-13 MED ORDER — METHOCARBAMOL 500 MG PO TABS
500.0000 mg | ORAL_TABLET | Freq: Three times a day (TID) | ORAL | 0 refills | Status: DC | PRN
Start: 2023-08-13 — End: 2023-10-24

## 2023-08-13 NOTE — Progress Notes (Signed)
 Established Patient Office Visit   Subjective:  Patient ID: Anna Burke, female    DOB: [birth year]  Age: 43 y.o. MRN: [id number]  Chief Complaint  Patient presents with   Dental Pain    Pt states she has been on antibiotics for a week. Left bottom tooth pain x 2 months.    Neck Pain    Left side neck pain x 3-4 weeks. Pain has increased in the past week.     Dental Pain   Neck Pain  Pertinent negatives include no tingling or weakness.   Encounter Diagnoses  Name Primary?   Strain of neck muscle, initial encounter Yes   Poor dentition    2 to 3 weeks history of pain and discomfort in her left lateral neck area.  No known injury.  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  Ongoing dental issues in need of definitive care.  Was seen at urgent care for a dental abscess just prior to onset of neck pain.  She is finishing her antibiotics today.  Tooth is doing better.  She was concerned about a possible relationship between her dental issues and her neck.  She is scheduled to see her dentist later this month she has had no chest pain, shortness of breath nausea or diaphoresis.  Reports ongoing intermittent paresthesias in her volar forearms.  Her mother has a history of heart disease.     Review of Systems  Constitutional: Negative.   HENT: Negative.    Eyes:  Negative for blurred vision, discharge and redness.  Respiratory: Negative.    Cardiovascular: Negative.   Gastrointestinal:  Negative for abdominal pain.  Genitourinary: Negative.   Musculoskeletal:  Positive for neck pain. Negative for myalgias.  Skin:  Negative for rash.  Neurological:  Negative for tingling, loss of consciousness and weakness.  Endo/Heme/Allergies:  Negative for polydipsia.     Current Outpatient Medications:    albuterol  (VENTOLIN  HFA) 108 (90 Base) MCG/ACT inhaler, Inhale 2 puffs into the lungs every 6 (six) hours as needed for wheezing or shortness of breath., Disp: 3 each,  Rfl: 3   buPROPion  (WELLBUTRIN  XL) 150 MG 24 hr tablet, Take 1 tablet (150 mg total) by mouth daily., Disp: 30 tablet, Rfl: 0   buPROPion  (WELLBUTRIN  XL) 300 MG 24 hr tablet, Take 1 tablet (300 mg total) by mouth daily., Disp: 90 tablet, Rfl: 3   diazepam  (VALIUM ) 5 MG tablet, TAKE 1 TABLET DAILY AS NEEDED, Disp: 30 tablet, Rfl: 0   esomeprazole  (NEXIUM ) 40 MG capsule, Take 1 capsule (40 mg total) by mouth daily., Disp: 90 capsule, Rfl: 3   famotidine  (PEPCID ) 40 MG tablet, Take 1 tablet (40 mg total) by mouth daily., Disp: 90 tablet, Rfl: 1   hydrOXYzine  (ATARAX ) 25 MG tablet, Take 1 tablet (25 mg total) by mouth every 8 (eight) hours as needed., Disp: 30 tablet, Rfl: 2   JUNEL FE 1/20 1-20 MG-MCG tablet, TAKE 1 TABLET DAILY, Disp: 84 tablet, Rfl: 0   lidocaine  (XYLOCAINE ) 2 % solution, Use as directed 15 mLs in the mouth or throat every 6 (six) hours as needed for mouth pain., Disp: 10 mL, Rfl: 0   lisinopril  (ZESTRIL ) 5 MG tablet, Take 1 tablet (5 mg total) by mouth daily., Disp: 90 tablet, Rfl: 3   meloxicam (MOBIC) 7.5 MG tablet, Take 1 tablet (7.5 mg total) by mouth daily., Disp: 30 tablet, Rfl: 0   methocarbamol (ROBAXIN) 500 MG tablet, Take 1 tablet (500  mg total) by mouth every 8 (eight) hours as needed for muscle spasms., Disp: 30 tablet, Rfl: 0   modafinil  (PROVIGIL ) 100 MG tablet, Take 1 tablet (100 mg total) by mouth daily., Disp: 90 tablet, Rfl: 0   rizatriptan  (MAXALT ) 10 MG tablet, Take 1 tablet (10 mg total) by mouth as needed for migraine. May repeat in 2 hours if needed, Disp: 10 tablet, Rfl: 3   Objective:     BP 114/76 (Patient Position: Sitting, Cuff Size: Normal)   Pulse 64   Temp 97.6 F (36.4 C) (Temporal)   Ht 5\' 4"  (1.626 m)   Wt 219 lb 6.4 oz (99.5 kg)   SpO2 96%   BMI 37.66 kg/m    Physical Exam Constitutional:      General: She is not in acute distress.    Appearance: Normal appearance. She is not ill-appearing, toxic-appearing or diaphoretic.  HENT:      Head: Normocephalic and atraumatic.     Right Ear: External ear normal.     Left Ear: External ear normal.     Mouth/Throat:   Eyes:     General: No scleral icterus.       Right eye: No discharge.        Left eye: No discharge.     Extraocular Movements: Extraocular movements intact.     Conjunctiva/sclera: Conjunctivae normal.  Pulmonary:     Effort: Pulmonary effort is normal. No respiratory distress.  Musculoskeletal:     Cervical back: Tenderness (Tenderness in the left lateral strap muscles.) present. No bony tenderness. No pain with movement. Normal range of motion.       Back:  Skin:    General: Skin is warm and dry.  Neurological:     Mental Status: She is alert and oriented to person, place, and time.     Motor: No weakness.     Deep Tendon Reflexes:     Reflex Scores:      Tricep reflexes are 1+ on the right side and 1+ on the left side.      Bicep reflexes are 1+ on the right side and 1+ on the left side.      Brachioradialis reflexes are 1+ on the right side and 1+ on the left side. Psychiatric:        Mood and Affect: Mood normal.        Behavior: Behavior normal.      No results found for any visits on 08/13/23.    The 10-year ASCVD risk score (Arnett DK, et al., 2019) is: 1%    Assessment & Plan:   Strain of neck muscle, initial encounter -     Meloxicam; Take 1 tablet (7.5 mg total) by mouth daily.  Dispense: 30 tablet; Refill: 0 -     Methocarbamol; Take 1 tablet (500 mg total) by mouth every 8 (eight) hours as needed for muscle spasms.  Dispense: 30 tablet; Refill: 0  Poor dentition    Return Schedule follow up with Dr. Arabella Beach.  Cervical strain with the possibility of radiculopathy.  Start meloxicam and Robaxin as needed.  Neck exercises were given.  Needs follow-up with dentist for definitive dental care.  Tonna Frederic, MD

## 2023-08-15 ENCOUNTER — Other Ambulatory Visit: Payer: Self-pay | Admitting: Family Medicine

## 2023-08-15 DIAGNOSIS — G4726 Circadian rhythm sleep disorder, shift work type: Secondary | ICD-10-CM

## 2023-08-15 DIAGNOSIS — R5382 Chronic fatigue, unspecified: Secondary | ICD-10-CM

## 2023-08-19 ENCOUNTER — Other Ambulatory Visit: Payer: Self-pay | Admitting: Family Medicine

## 2023-08-19 DIAGNOSIS — I10 Essential (primary) hypertension: Secondary | ICD-10-CM

## 2023-08-29 ENCOUNTER — Encounter: Payer: Self-pay | Admitting: Family Medicine

## 2023-08-31 ENCOUNTER — Other Ambulatory Visit: Payer: Self-pay | Admitting: Family Medicine

## 2023-08-31 MED ORDER — VALACYCLOVIR HCL 1 G PO TABS: 2000.0000 mg | ORAL_TABLET | Freq: Two times a day (BID) | ORAL | 2 refills | Status: DC

## 2023-09-15 ENCOUNTER — Other Ambulatory Visit: Payer: Self-pay | Admitting: Family Medicine

## 2023-09-15 DIAGNOSIS — I10 Essential (primary) hypertension: Secondary | ICD-10-CM

## 2023-09-25 IMAGING — CR DG CHEST 2V
2 series · 2 of 2 positions shown · non-contrast
Comparison: 04/22/2019

CLINICAL DATA: Chest pain radiating to the right shoulder.

EXAM:
CHEST - 2 VIEW

[w chest pa]
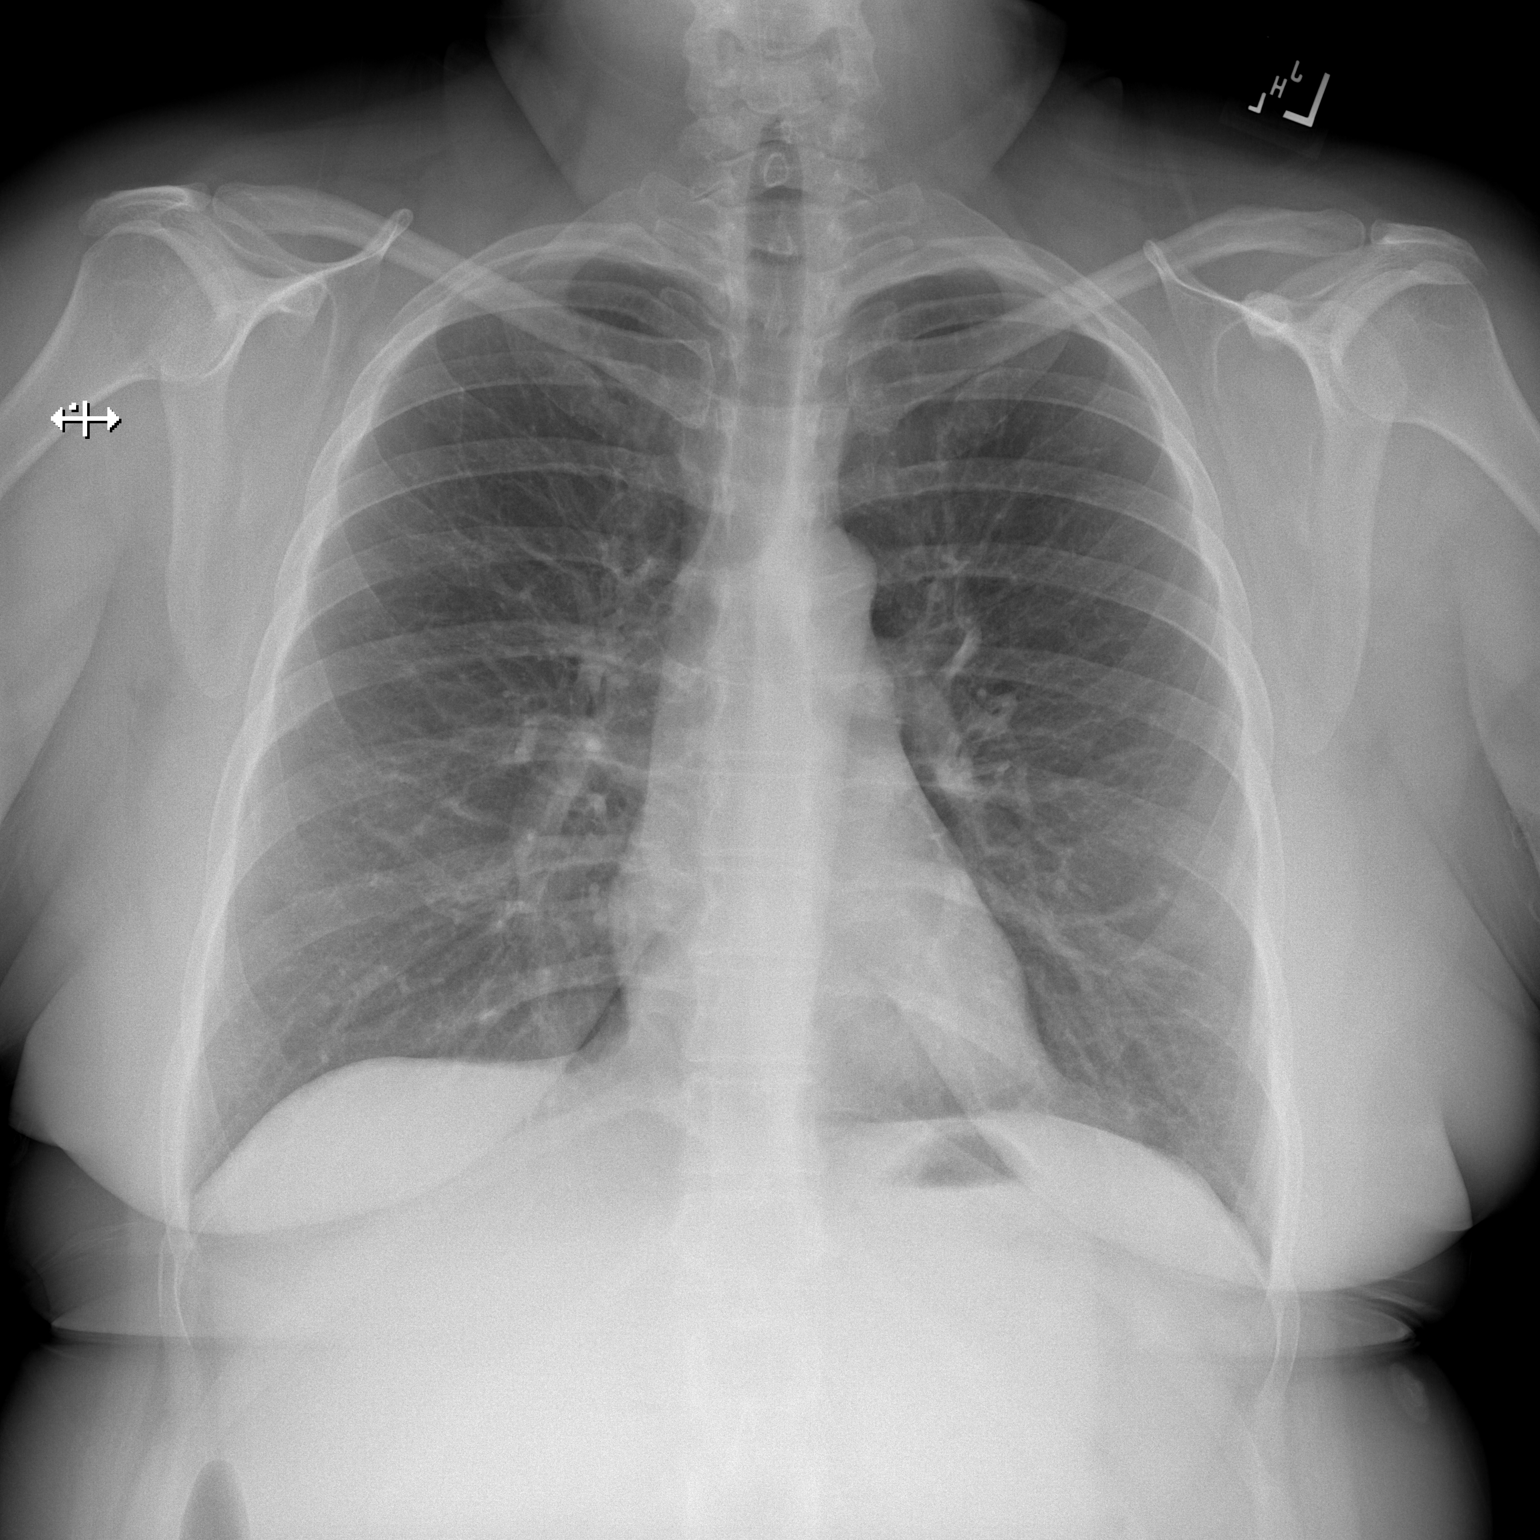

[w chest lat]
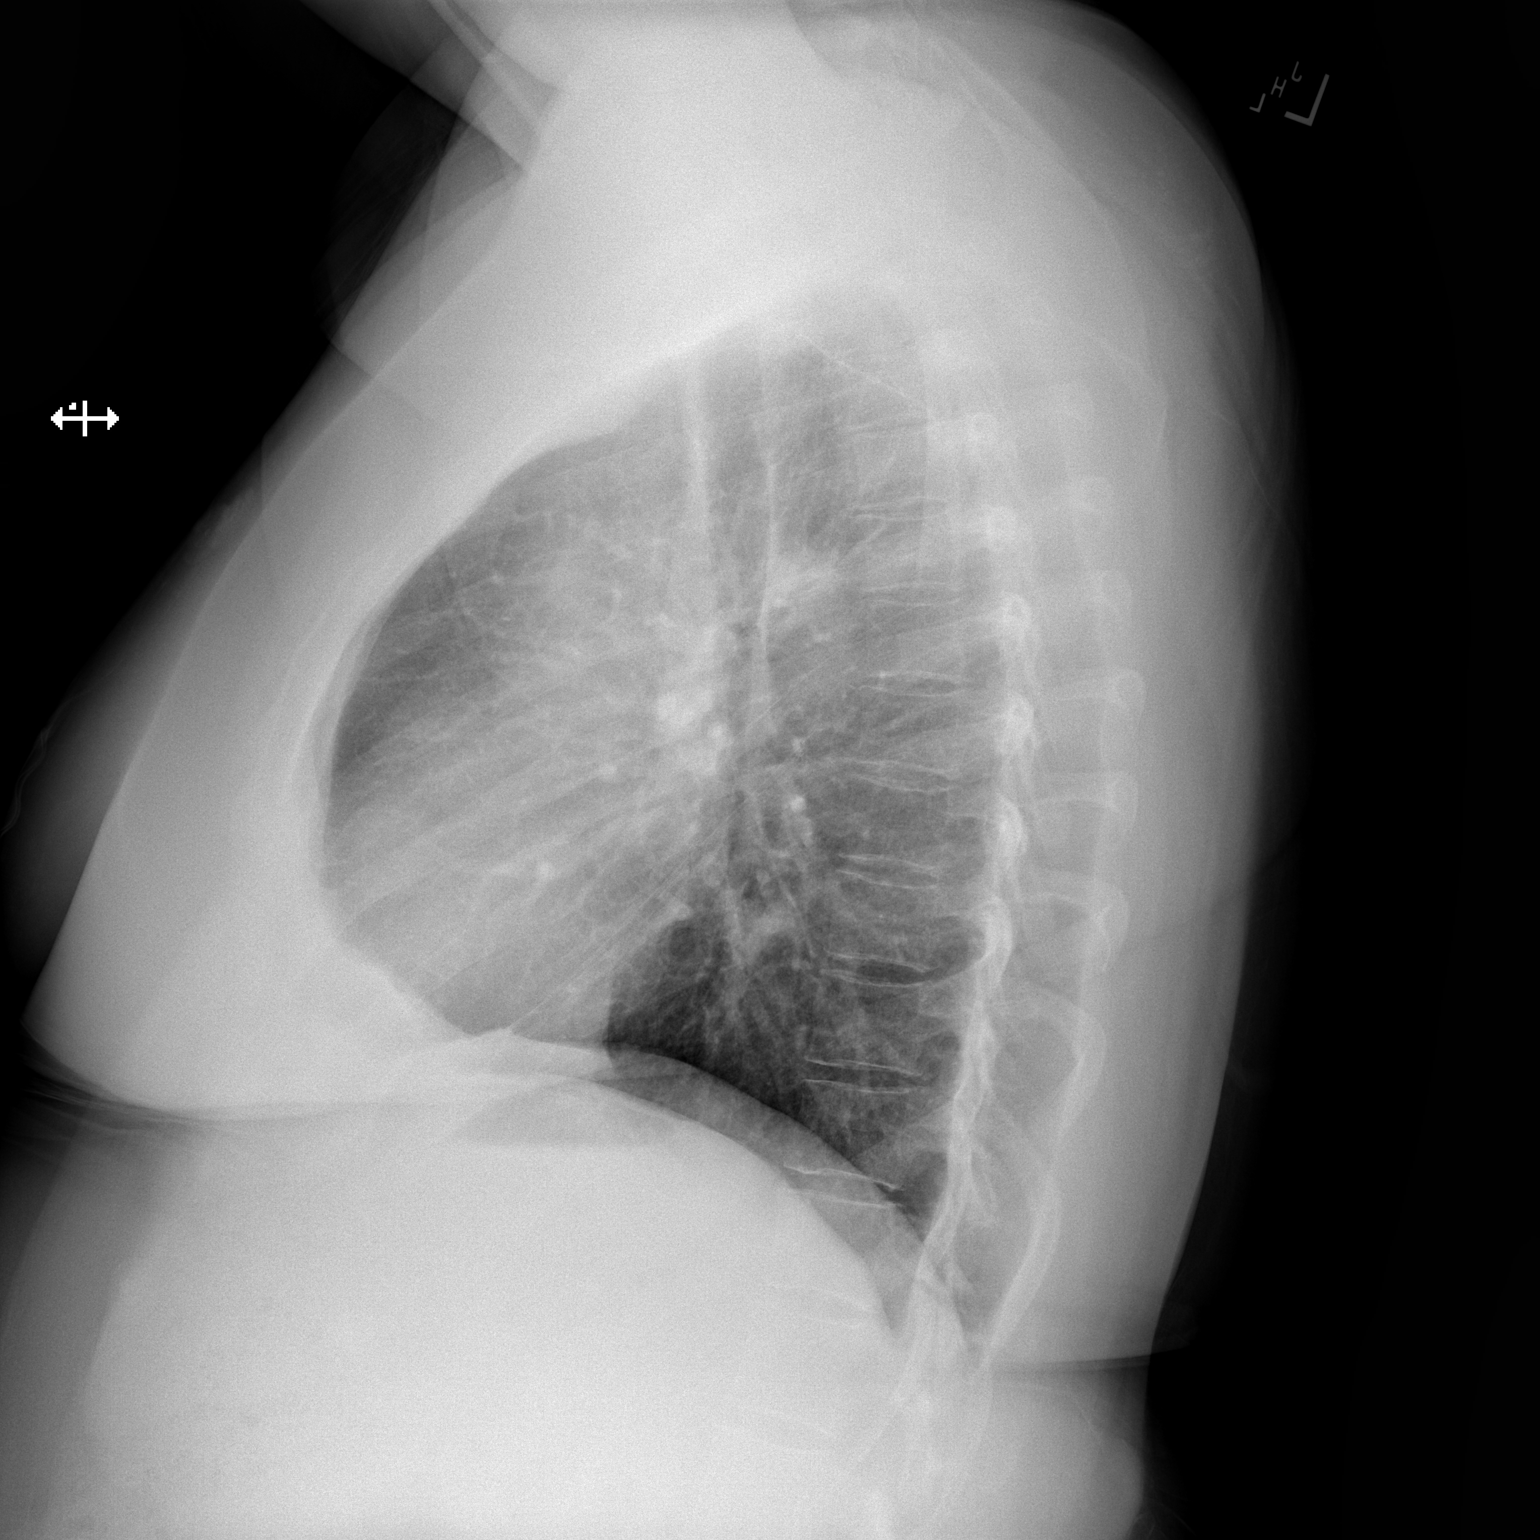

[2 of 2 positions shown; findings below may reference images not displayed]

FINDINGS: The heart size and mediastinal contours are within normal limits.
Both lungs are clear. The visualized skeletal structures are
unremarkable.
IMPRESSION: No active cardiopulmonary disease.

## 2023-10-04 ENCOUNTER — Other Ambulatory Visit: Payer: Self-pay | Admitting: Family Medicine

## 2023-10-04 DIAGNOSIS — N3010 Interstitial cystitis (chronic) without hematuria: Secondary | ICD-10-CM

## 2023-10-10 ENCOUNTER — Other Ambulatory Visit: Payer: Self-pay | Admitting: Family Medicine

## 2023-10-10 DIAGNOSIS — I10 Essential (primary) hypertension: Secondary | ICD-10-CM

## 2023-10-24 ENCOUNTER — Encounter: Payer: Self-pay | Admitting: Family Medicine

## 2023-10-24 ENCOUNTER — Ambulatory Visit: Admitting: Family Medicine

## 2023-10-24 VITALS — BP 129/83 | HR 84 | Ht 64.0 in | Wt 222.4 lb

## 2023-10-24 DIAGNOSIS — Z1231 Encounter for screening mammogram for malignant neoplasm of breast: Secondary | ICD-10-CM | POA: Diagnosis not present

## 2023-10-24 DIAGNOSIS — M25512 Pain in left shoulder: Secondary | ICD-10-CM

## 2023-10-24 DIAGNOSIS — G43009 Migraine without aura, not intractable, without status migrainosus: Secondary | ICD-10-CM

## 2023-10-24 NOTE — Assessment & Plan Note (Signed)
 History of migraines, which have increased in frequency recently after running out of bupropion . Reports bupropion  was effective for migraine prevention. - Restart bupropion  as soon as it arrives. - Continue rizatriptan  as needed. - Monitor headache frequency after restarting bupropion .

## 2023-10-24 NOTE — Assessment & Plan Note (Signed)
 Intermittent soreness in the chest and shoulder area for several months, described as a bruised feeling, with tenderness over the sternoclavicular joint. Examination suggests costochondritis or inflammation of the sternoclavicular/acromioclavicular joints. Does not appear to be muscular in origin. - Recommend trial of topical Voltaren gel up to four times daily. - Recommend trial of oral NSAIDs daily for two weeks. - Advised on gentle stretching exercises. - Advised to use ice or heat for comfort. - If symptoms persist after one month, will refer to Sports Medicine.

## 2023-10-24 NOTE — Patient Instructions (Addendum)
 It was nice to see you today,  We addressed the following topics today: -I think your shoulder and chest pain is due to inflammation of the cartilage or joint spaces near your clavicle.  You can use Voltaren gel up to 4 times a day.  It is an anti-inflammatory topical agent.  It is over-the-counter. - Also try the exercises I provided for you. - If this is not getting better after a month let me know and I will send in a referral to the sports medicine doctor - If your headaches do not get better after restarting your bupropion  let me know. - I am sending in a referral for a mammogram.  Someone will call you to schedule this.  Have a great day,  Rolan Slain, MD

## 2023-10-24 NOTE — Progress Notes (Signed)
 Established Patient Office Visit  Subjective   Patient ID: Anna Burke, female    DOB: November 24, 1980  Age: 43 y.o. MRN: [id number]  Chief Complaint  Patient presents with   Headache    HPI  Subjective - Reports intermittent chest and shoulder soreness for several months. Describes the sensation as soreness or a bruised feeling, not a sharp, stabbing pain. It occurs a couple of days a week without a clear trigger. Notes soreness over the collarbone that can radiate. Reaching for objects can exacerbate the soreness. - Reports recent onset of migraines over the past week, which they attribute to running out of bupropion  about a week and a half ago. States bupropion  was effective in managing migraines, especially at the higher dose. Used rizatriptan  once this week but finds Excedrin more effective and less sedating. Drinks caffeine to help with headaches. - Reports an ongoing tooth issue on one side causing jaw pain. Has dental appointments scheduled, but they are months away. A dentist is assessing a tooth for extraction versus repair. - Notes an allergy to sulfa drugs. Reports a recent visit with another provider who prescribed a sulfa drug, which was identified by the pharmacist and not taken. The same provider also prescribed a muscle relaxer which was not felt to be helpful.  Medications Bupropion  (ran out 1.5 weeks ago, awaiting refill), Rizatriptan  PRN for migraines.  PMH, PSH, FH, Social Hx Allergy to sulfa drugs.  ROS - Constitutional: No fever, chills. - MSK: Reports intermittent chest and shoulder soreness. No reported muscle pull. - Neurological: Reports migraines. No report of weakness or numbness.   The 10-year ASCVD risk score (Arnett DK, et al., 2019) is: 1.2%  Health Maintenance Due  Topic Date Due   Hepatitis C Screening  Never done   Pneumococcal Vaccine 61-37 Years old (1 of 2 - PCV) Never done   Hepatitis B Vaccines (1 of 3 - 19+ 3-dose series) Never done    HPV VACCINES (1 - 3-dose SCDM series) Never done   Cervical Cancer Screening (HPV/Pap Cotest)  03/29/2018   COVID-19 Vaccine (4 - 2024-25 season) 12/09/2022      Objective:     BP 129/83   Pulse 84   Ht 5' 4 (1.626 m)   Wt 222 lb 6.4 oz (100.9 kg)   SpO2 98%   BMI 38.17 kg/m    Physical Exam General: No acute distress. CV: No reported heart-related symptoms. MSK: Tenderness to palpation over the sternoclavicular joint and along the clavicle. No tenderness on the contralateral side. Strength testing of the shoulder (abduction, internal/external rotation, forward flexion) was largely unremarkable, though some movements elicited slight discomfort. Pain noted with resisted downward pressure on arms extended forward with thumbs down.   No results found for any visits on 10/24/23.      Assessment & Plan:   Migraine without aura and without status migrainosus, not intractable Assessment & Plan: History of migraines, which have increased in frequency recently after running out of bupropion . Reports bupropion  was effective for migraine prevention. - Restart bupropion  as soon as it arrives. - Continue rizatriptan  as needed. - Monitor headache frequency after restarting bupropion .   Arthralgia of left acromioclavicular joint Assessment & Plan: Intermittent soreness in the chest and shoulder area for several months, described as a bruised feeling, with tenderness over the sternoclavicular joint. Examination suggests costochondritis or inflammation of the sternoclavicular/acromioclavicular joints. Does not appear to be muscular in origin. - Recommend trial of topical Voltaren gel up to  four times daily. - Recommend trial of oral NSAIDs daily for two weeks. - Advised on gentle stretching exercises. - Advised to use ice or heat for comfort. - If symptoms persist after one month, will refer to Sports Medicine.   Encounter for screening mammogram for malignant neoplasm of  breast -     3D Screening Mammogram, Left and Right; Future     Return in about 6 months (around 04/25/2024) for migraines.    Toribio MARLA Slain, MD

## 2023-11-03 ENCOUNTER — Other Ambulatory Visit: Payer: Self-pay | Admitting: Family Medicine

## 2023-11-03 DIAGNOSIS — I10 Essential (primary) hypertension: Secondary | ICD-10-CM

## 2023-11-28 ENCOUNTER — Other Ambulatory Visit: Payer: Self-pay

## 2023-11-28 DIAGNOSIS — I10 Essential (primary) hypertension: Secondary | ICD-10-CM

## 2023-12-23 ENCOUNTER — Other Ambulatory Visit: Payer: Self-pay | Admitting: Family Medicine

## 2023-12-23 DIAGNOSIS — I10 Essential (primary) hypertension: Secondary | ICD-10-CM

## 2024-01-08 ENCOUNTER — Ambulatory Visit: Admitting: Family Medicine

## 2024-01-17 ENCOUNTER — Other Ambulatory Visit: Payer: Self-pay | Admitting: Family Medicine

## 2024-01-17 DIAGNOSIS — I10 Essential (primary) hypertension: Secondary | ICD-10-CM

## 2024-01-30 ENCOUNTER — Other Ambulatory Visit: Payer: Self-pay | Admitting: Family Medicine

## 2024-01-30 DIAGNOSIS — N3010 Interstitial cystitis (chronic) without hematuria: Secondary | ICD-10-CM

## 2024-01-30 DIAGNOSIS — G4726 Circadian rhythm sleep disorder, shift work type: Secondary | ICD-10-CM

## 2024-01-30 DIAGNOSIS — R5382 Chronic fatigue, unspecified: Secondary | ICD-10-CM

## 2024-01-31 ENCOUNTER — Other Ambulatory Visit: Payer: Self-pay | Admitting: Family Medicine

## 2024-01-31 DIAGNOSIS — G4726 Circadian rhythm sleep disorder, shift work type: Secondary | ICD-10-CM

## 2024-01-31 DIAGNOSIS — R5382 Chronic fatigue, unspecified: Secondary | ICD-10-CM

## 2024-01-31 MED ORDER — MODAFINIL 100 MG PO TABS: 100.0000 mg | ORAL_TABLET | Freq: Every day | ORAL | 0 refills | Status: AC

## 2024-02-13 ENCOUNTER — Other Ambulatory Visit: Payer: Self-pay | Admitting: Family Medicine

## 2024-02-13 DIAGNOSIS — I10 Essential (primary) hypertension: Secondary | ICD-10-CM

## 2024-03-11 ENCOUNTER — Other Ambulatory Visit: Payer: Self-pay | Admitting: Family Medicine

## 2024-03-11 ENCOUNTER — Encounter: Payer: Self-pay | Admitting: Family Medicine

## 2024-03-11 ENCOUNTER — Ambulatory Visit: Admitting: Family Medicine

## 2024-03-11 VITALS — BP 117/75 | HR 91 | Ht 64.0 in | Wt 207.1 lb

## 2024-03-11 DIAGNOSIS — K219 Gastro-esophageal reflux disease without esophagitis: Secondary | ICD-10-CM | POA: Diagnosis not present

## 2024-03-11 DIAGNOSIS — G43009 Migraine without aura, not intractable, without status migrainosus: Secondary | ICD-10-CM

## 2024-03-11 DIAGNOSIS — R10A1 Flank pain, right side: Secondary | ICD-10-CM | POA: Diagnosis not present

## 2024-03-11 DIAGNOSIS — N951 Menopausal and female climacteric states: Secondary | ICD-10-CM

## 2024-03-11 LAB — POCT URINALYSIS DIP (CLINITEK)
Bilirubin, UA: NEGATIVE
Blood, UA: NEGATIVE
Glucose, UA: NEGATIVE mg/dL
Ketones, POC UA: NEGATIVE mg/dL
Leukocytes, UA: NEGATIVE
Nitrite, UA: NEGATIVE
POC PROTEIN,UA: NEGATIVE
Spec Grav, UA: 1.025 (ref 1.010–1.025)
Urobilinogen, UA: 0.2 U/dL
pH, UA: 5.5 (ref 5.0–8.0)

## 2024-03-11 MED ORDER — FAMOTIDINE 40 MG PO TABS: 40.0000 mg | ORAL_TABLET | Freq: Every day | ORAL | 1 refills | Status: DC

## 2024-03-11 MED ORDER — MINOXIDIL 2.5 MG PO TABS: 1.2500 mg | ORAL_TABLET | Freq: Every day | ORAL | 2 refills | Status: DC

## 2024-03-11 MED ORDER — ZAVZPRET 10 MG/ACT NA SOLN: 1.0000 | Freq: Every day | NASAL | 2 refills | Status: DC | PRN

## 2024-03-11 NOTE — Progress Notes (Unsigned)
 Acute Office Visit  Subjective:     Patient ID: SELITA STAIGER, female    DOB: 03/22/1981, 43 y.o.   MRN: [id number]  No chief complaint on file.   HPI Patient is in today for   Subjective - Right-sided flank/back pain for approximately 3 weeks. Described as an occasional dull pain that can worsen. Not constant. Not related to meals, urination, or specific movements, but is noticeable with slow stretching like yoga. No known injury or strenuous activity. - Reports intense, daily hot flashes and night sweats that are disruptive to sleep. Last menstrual period was 2.5 months ago, with cycles becoming more spaced out over the past year. Stopped birth control to evaluate symptoms. - Migraines are overall improved but has had a few days in a row of severe headaches recently. Excedrin Migraine and hydration are usually effective. Rizatriptan  is effective for severe episodes but causes fatigue.  Medications: Rizatriptan  as needed for migraines. Sometimes takes ibuprofen . Was previously on birth control but discontinued it.  PMH: Interstitial cystitis, migraines. PSH: None mentioned. FH: Kidney stones. 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  ROS: - Constitutional: Reports hot flashes and night sweats. Denies fevers (checked temperature). Reports feeling dehydrated. - GI: Denies nausea, vomiting, diarrhea, or constipation. - GU: Denies worsening pain with urination. - MSK: Reports pain is reproducible with palpation and felt with some stretching/twisting, but not significantly worsened. Denies known injury.  Objective General: No acute distress. MSK: Tenderness to palpation over the right flank/lower ribs. Described as soreness. Pain is felt with lateral bending and twisting but not significantly exacerbated. GU: Urinalysis negative for blood.  Assessment and Plan Right Flank Pain - Patient presents with a 3-week history of right-sided flank/back pain. Pain is  described as a dull ache, reproducible with palpation over the lower ribs. No associated GI or GU symptoms. Urinalysis is negative. The presentation is most consistent with a musculoskeletal strain of the intercostal or surrounding muscles rather than an intra-abdominal or renal etiology. - Continue monitoring symptoms. - Apply heat to the affected area. - Use topical analgesics such as Voltaren gel or Icy Hot. - Perform gentle stretching exercises. - May use Tylenol . - May use naproxen (Aleve) for up to 1-2 weeks if other measures are not effective. - If pain becomes severe (10/10) or other concerning symptoms develop, message for urgent imaging to rule out nephrolithiasis.  Perimenopausal Symptoms - Experiencing hot flashes and night sweats with increasingly irregular menstrual cycles. Symptoms worsened after stopping OCPs. These are consistent with perimenopausal symptoms. - Discussed hormonal and non-hormonal treatment options. Non-hormonal options include SSRIs or gabapentin. Hormonal options include various forms of estrogen/progesterone. - Will provide a list of hormonal therapy options to review. - Plan to follow up in January to discuss preferences and initiate treatment if desired.  Migraines - Migraines are generally better controlled but still has breakthrough episodes. Rizatriptan  is effective but causes significant fatigue. - Prescribed zavegepant (Zavzpret ) nasal spray as an alternative abortive therapy that is less likely to cause fatigue. - Will provide a sample and a coupon for a free first month's supply. - Continue using rizatriptan  if needed. - Follow up in January to assess the efficacy of the new medication.    ROS      Objective:    There were no vitals taken for this visit. {Vitals History (Optional):23777}  Physical Exam  No results found for any visits on 03/11/24.      Assessment & Plan:   There  are no diagnoses linked to this encounter.   No  follow-ups on file.  Toribio MARLA Slain, MD

## 2024-03-11 NOTE — Patient Instructions (Addendum)
 It was nice to see you today,  We addressed the following topics today: - I will provide you with a handout of gentle stretching exercises for your back pain. - You can use a heating pad on the sore area. You can also try topical creams like Voltaren or Icy Hot. - For pain, Tylenol  is safe. If that is not enough, you can try an NSAID like naproxen (Aleve), but do not take it for more than two weeks straight. - If the pain suddenly gets much worse, please message me right away as we may need to get imaging to look for kidney stones. - I am sending a prescription for a migraine nasal spray called Zavzpret. I will give you a sample and a coupon. Blow your nose before using it, then insert the device into one nostril and spray. You can try this instead of the rizatriptan  to see if it works for you without causing as much fatigue. - I will give you some information to read about different hormone therapy options for your hot flashes. We can discuss this more at your next visit. - Please schedule a follow-up appointment for January. - options for treating vasomotor symptoms include: non hormonal options like gabapentin and ssri's and hormonal options like estrogen pills, combined oral contraceptives, and estrogen containing patches.    Have a great day,  Rolan Slain, MD

## 2024-03-13 ENCOUNTER — Other Ambulatory Visit: Payer: Self-pay | Admitting: Family Medicine

## 2024-03-13 DIAGNOSIS — R7989 Other specified abnormal findings of blood chemistry: Secondary | ICD-10-CM

## 2024-03-13 DIAGNOSIS — I10 Essential (primary) hypertension: Secondary | ICD-10-CM

## 2024-03-13 DIAGNOSIS — E66812 Obesity, class 2: Secondary | ICD-10-CM

## 2024-03-15 DIAGNOSIS — R10A1 Flank pain, right side: Secondary | ICD-10-CM | POA: Insufficient documentation

## 2024-03-15 DIAGNOSIS — N951 Menopausal and female climacteric states: Secondary | ICD-10-CM | POA: Insufficient documentation

## 2024-03-15 NOTE — Assessment & Plan Note (Signed)
-   Patient presents with a 3-week history of right-sided flank/back pain. Pain is described as a dull ache, reproducible with palpation over the lower ribs. No associated GI or GU symptoms. Urinalysis is negative. The presentation is most consistent with a musculoskeletal strain of the intercostal or surrounding muscles rather than an intra-abdominal or renal etiology. - Continue monitoring symptoms. - Apply heat to the affected area. - Use topical analgesics such as Voltaren gel or Icy Hot. - Perform gentle stretching exercises. - May use Tylenol . - May use naproxen (Aleve) for up to 1-2 weeks if other measures are not effective. - If pain becomes severe (10/10) or other concerning symptoms develop, message for urgent imaging to rule out nephrolithiasis.

## 2024-03-15 NOTE — Assessment & Plan Note (Signed)
-   Experiencing hot flashes and night sweats with increasingly irregular menstrual cycles. Symptoms worsened after stopping OCPs. These are consistent with perimenopausal symptoms. - Discussed hormonal and non-hormonal treatment options. Non-hormonal options include SSRIs or gabapentin. Hormonal options include various forms of estrogen/progesterone. - Plan to follow up in January to discuss preferences and initiate treatment if desired.

## 2024-03-15 NOTE — Assessment & Plan Note (Signed)
-   Migraines are generally better controlled but still has breakthrough episodes. Rizatriptan  is effective but causes significant fatigue. - Prescribed zavegepant (Zavzpret ) nasal spray as an alternative abortive therapy that is less likely to cause fatigue. - Will provide a sample and a coupon for a free first month's supply. - Continue using rizatriptan  if needed. - Follow up in January to assess the efficacy of the new medication.

## 2024-03-17 ENCOUNTER — Telehealth: Payer: Self-pay

## 2024-03-17 NOTE — Telephone Encounter (Signed)
 Called patient she stated that she haven't had  time to pick it up

## 2024-03-18 ENCOUNTER — Other Ambulatory Visit

## 2024-03-18 DIAGNOSIS — E66812 Obesity, class 2: Secondary | ICD-10-CM

## 2024-03-18 DIAGNOSIS — I10 Essential (primary) hypertension: Secondary | ICD-10-CM

## 2024-03-18 DIAGNOSIS — R7989 Other specified abnormal findings of blood chemistry: Secondary | ICD-10-CM

## 2024-03-25 ENCOUNTER — Other Ambulatory Visit

## 2024-03-30 ENCOUNTER — Encounter: Payer: Self-pay | Admitting: Family Medicine

## 2024-04-08 ENCOUNTER — Other Ambulatory Visit

## 2024-04-09 LAB — CBC WITH DIFFERENTIAL/PLATELET
Basophils Absolute: 0.1 x10E3/uL (ref 0.0–0.2)
Basos: 1 %
EOS (ABSOLUTE): 0.2 x10E3/uL (ref 0.0–0.4)
Eos: 2 %
Hematocrit: 39.9 % (ref 34.0–46.6)
Hemoglobin: 12.9 g/dL (ref 11.1–15.9)
Immature Grans (Abs): 0 x10E3/uL (ref 0.0–0.1)
Immature Granulocytes: 0 %
Lymphocytes Absolute: 4.7 x10E3/uL — ABNORMAL HIGH (ref 0.7–3.1)
Lymphs: 50 %
MCH: 30.2 pg (ref 26.6–33.0)
MCHC: 32.3 g/dL (ref 31.5–35.7)
MCV: 93 fL (ref 79–97)
Monocytes Absolute: 0.7 x10E3/uL (ref 0.1–0.9)
Monocytes: 7 %
Neutrophils Absolute: 3.7 x10E3/uL (ref 1.4–7.0)
Neutrophils: 40 %
Platelets: 341 x10E3/uL (ref 150–450)
RBC: 4.27 x10E6/uL (ref 3.77–5.28)
RDW: 12.9 % (ref 11.7–15.4)
WBC: 9.3 x10E3/uL (ref 3.4–10.8)

## 2024-04-09 LAB — COMPREHENSIVE METABOLIC PANEL WITH GFR
ALT: 18 IU/L (ref 0–32)
AST: 18 IU/L (ref 0–40)
Albumin: 4.3 g/dL (ref 3.9–4.9)
Alkaline Phosphatase: 129 IU/L — ABNORMAL HIGH (ref 41–116)
BUN/Creatinine Ratio: 15 (ref 9–23)
BUN: 14 mg/dL (ref 6–24)
Bilirubin Total: <0.2 mg/dL (ref 0.0–1.2)
CO2: 24 mmol/L (ref 20–29)
Calcium: 9.4 mg/dL (ref 8.7–10.2)
Chloride: 99 mmol/L (ref 96–106)
Creatinine, Ser: 0.95 mg/dL (ref 0.57–1.00)
Globulin, Total: 2.6 g/dL (ref 1.5–4.5)
Glucose: 69 mg/dL — ABNORMAL LOW (ref 70–99)
Potassium: 3.9 mmol/L (ref 3.5–5.2)
Sodium: 136 mmol/L (ref 134–144)
Total Protein: 6.9 g/dL (ref 6.0–8.5)
eGFR: 76 mL/min/1.73

## 2024-04-09 LAB — HEMOGLOBIN A1C
Est. average glucose Bld gHb Est-mCnc: 105 mg/dL
Hgb A1c MFr Bld: 5.3 % (ref 4.8–5.6)

## 2024-04-09 LAB — LIPID PANEL
Chol/HDL Ratio: 4.1 ratio (ref 0.0–4.4)
Cholesterol, Total: 227 mg/dL — ABNORMAL HIGH (ref 100–199)
HDL: 56 mg/dL
LDL Chol Calc (NIH): 115 mg/dL — ABNORMAL HIGH (ref 0–99)
Triglycerides: 326 mg/dL — ABNORMAL HIGH (ref 0–149)
VLDL Cholesterol Cal: 56 mg/dL — ABNORMAL HIGH (ref 5–40)

## 2024-04-09 LAB — TSH: TSH: 5.24 u[IU]/mL — ABNORMAL HIGH (ref 0.450–4.500)

## 2024-04-13 ENCOUNTER — Ambulatory Visit: Payer: Self-pay | Admitting: Family Medicine

## 2024-04-22 ENCOUNTER — Other Ambulatory Visit: Payer: Self-pay | Admitting: Family Medicine

## 2024-04-22 ENCOUNTER — Encounter: Payer: Self-pay | Admitting: Family Medicine

## 2024-04-22 ENCOUNTER — Ambulatory Visit: Admitting: Family Medicine

## 2024-04-22 VITALS — BP 120/82 | HR 85 | Ht 64.0 in | Wt 208.8 lb

## 2024-04-22 DIAGNOSIS — K219 Gastro-esophageal reflux disease without esophagitis: Secondary | ICD-10-CM | POA: Diagnosis not present

## 2024-04-22 DIAGNOSIS — G43009 Migraine without aura, not intractable, without status migrainosus: Secondary | ICD-10-CM | POA: Diagnosis not present

## 2024-04-22 DIAGNOSIS — R5382 Chronic fatigue, unspecified: Secondary | ICD-10-CM | POA: Diagnosis not present

## 2024-04-22 DIAGNOSIS — N3010 Interstitial cystitis (chronic) without hematuria: Secondary | ICD-10-CM | POA: Diagnosis not present

## 2024-04-22 DIAGNOSIS — L659 Nonscarring hair loss, unspecified: Secondary | ICD-10-CM | POA: Diagnosis not present

## 2024-04-22 DIAGNOSIS — E785 Hyperlipidemia, unspecified: Secondary | ICD-10-CM | POA: Insufficient documentation

## 2024-04-22 DIAGNOSIS — E782 Mixed hyperlipidemia: Secondary | ICD-10-CM | POA: Diagnosis not present

## 2024-04-22 DIAGNOSIS — R7989 Other specified abnormal findings of blood chemistry: Secondary | ICD-10-CM | POA: Diagnosis not present

## 2024-04-22 MED ORDER — MINOXIDIL 2.5 MG PO TABS: 1.2500 mg | ORAL_TABLET | Freq: Every day | ORAL | 3 refills | Status: AC

## 2024-04-22 MED ORDER — ZAVZPRET 10 MG/ACT NA SOLN: 1.0000 | Freq: Every day | NASAL | 2 refills | Status: AC | PRN

## 2024-04-22 MED ORDER — FAMOTIDINE 40 MG PO TABS: 40.0000 mg | ORAL_TABLET | Freq: Every day | ORAL | 3 refills | Status: AC

## 2024-04-22 MED ORDER — HYDROXYZINE HCL 25 MG PO TABS: 25.0000 mg | ORAL_TABLET | Freq: Every day | ORAL | 3 refills | Status: AC

## 2024-04-22 NOTE — Assessment & Plan Note (Signed)
 Chronic interstitial cystitis Symptoms exacerbated by stopping hydroxyzine . Hydroxyzine  effective when taken daily. - Prescribed hydroxyzine  for daily use via mail order pharmacy.

## 2024-04-22 NOTE — Assessment & Plan Note (Signed)
 Prefers mail order for medication. - Prescribed famotidine  for 90-day supply via mail order pharmacy.

## 2024-04-22 NOTE — Patient Instructions (Signed)
 It was nice to see you today,  We addressed the following topics today: - I have sent in your prescriptions for 90 days to the mail in pharmacy.  - I have resent the zavzpret  and given you a copay card to use.  - you will need to schedule a lab visit to recheck your thyroid  levels  Have a great day,  Rolan Slain, MD

## 2024-04-22 NOTE — Progress Notes (Signed)
 "  Established Patient Office Visit  Subjective   Patient ID: Anna Burke, female    DOB: 25-Apr-1980  Age: 44 y.o. MRN: [id number]  Chief Complaint  Patient presents with   Medical Management of Chronic Issues     History of Present Illness   Anna Burke is a 44 year old female who presents with concerns about thyroid  issues and ongoing hot flashes.  She reports persistent hot flashes that disrupt her sleep and is concerned about thyroid  disease given her mother's hypothyroidism and a recent slightly elevated TSH.  She has interstitial cystitis controlled with hydroxyzine . After she ran out and used it only as needed, her bladder pain increased.  She has not yet started her minoxodil for hair loss.    She has migraines and was prescribed Zavzpret  nasal spray but has been unable to obtain it due to issues with pharmacy.          The 10-year ASCVD risk score (Arnett DK, et al., 2019) is: 1%  Health Maintenance Due  Topic Date Due   Hepatitis C Screening  Never done   Pneumococcal Vaccine (1 of 2 - PCV) Never done   Hepatitis B Vaccines 19-59 Average Risk (1 of 3 - 19+ 3-dose series) Never done   HPV VACCINES (1 - 3-dose SCDM series) Never done   Cervical Cancer Screening (HPV/Pap Cotest)  03/29/2018   Mammogram  Never done   Influenza Vaccine  Never done   COVID-19 Vaccine (4 - 2025-26 season) 12/09/2023      Objective:     BP 120/82   Pulse 85   Ht 5' 4 (1.626 m)   Wt 208 lb 12.8 oz (94.7 kg)   SpO2 100%   BMI 35.84 kg/m    Physical Exam     Gen: alert, oriented Pulm: no respiratory distress Psych: pleasant affect       No results found for any visits on 04/22/24.      Assessment & Plan:   Elevated TSH Assessment & Plan: Slightly elevated TSH with symptoms of hot flashes and night awakenings. Family history of thyroid  issues. Further testing required. - Ordered repeat TSH and free T4 tests. - Ordered thyroid  autoimmune  antibody tests. - Will reassess thyroid  function based on test results before initiating treatment.   Chronic interstitial cystitis Assessment & Plan: Chronic interstitial cystitis Symptoms exacerbated by stopping hydroxyzine . Hydroxyzine  effective when taken daily. - Prescribed hydroxyzine  for daily use via mail order pharmacy.  Orders: -     hydrOXYzine  HCl; Take 1 tablet (25 mg total) by mouth at bedtime.  Dispense: 30 tablet; Refill: 3  Gastroesophageal reflux disease without esophagitis Assessment & Plan: Prefers mail order for medication. - Prescribed famotidine  for 90-day supply via mail order pharmacy.   Orders: -     Famotidine ; Take 1 tablet (40 mg total) by mouth daily.  Dispense: 90 tablet; Refill: 3  Moderate mixed hyperlipidemia not requiring statin therapy Assessment & Plan: Mildly elevated cholesterol levels. High dairy intake may contribute. - Advised dietary modifications to reduce saturated fat intake to single digits per day. - Continue monitoring cholesterol levels.   Migraine without aura and without status migrainosus, not intractable Assessment & Plan: Zavzpret  nasal spray not initiated due to pharmacy issues. Prescription access needs resolution. - Provided coupon for zavzpret  nasal spray and instructed to use it at a pharmacy accepting copay cards. - Resent prescription for zavzpret  nasal spray to the pharmacy.   Other orders -  Minoxidil ; Take 0.5 tablets (1.25 mg total) by mouth daily.  Dispense: 45 tablet; Refill: 3 -     Zavzpret ; Place 1 spray into the nose daily as needed.  Dispense: 6 each; Refill: 2               Return in about 3 months (around 07/21/2024) for physical.    Toribio MARLA Slain, MD  "

## 2024-04-22 NOTE — Assessment & Plan Note (Signed)
 Zavzpret  nasal spray not initiated due to pharmacy issues. Prescription access needs resolution. - Provided coupon for zavzpret  nasal spray and instructed to use it at a pharmacy accepting copay cards. - Resent prescription for zavzpret  nasal spray to the pharmacy.

## 2024-04-22 NOTE — Assessment & Plan Note (Signed)
 Mildly elevated cholesterol levels. High dairy intake may contribute. - Advised dietary modifications to reduce saturated fat intake to single digits per day. - Continue monitoring cholesterol levels.

## 2024-04-22 NOTE — Assessment & Plan Note (Signed)
 Slightly elevated TSH with symptoms of hot flashes and night awakenings. Family history of thyroid  issues. Further testing required. - Ordered repeat TSH and free T4 tests. - Ordered thyroid  autoimmune antibody tests. - Will reassess thyroid  function based on test results before initiating treatment.

## 2024-04-23 ENCOUNTER — Other Ambulatory Visit (HOSPITAL_COMMUNITY): Payer: Self-pay

## 2024-04-28 NOTE — Addendum Note (Signed)
 Addended by: ZIMMERMAN RUMPLE, [place] Rios D on: 04/28/2024 08:38 AM   Modules accepted: Orders

## 2024-04-29 ENCOUNTER — Other Ambulatory Visit

## 2024-05-04 ENCOUNTER — Other Ambulatory Visit: Payer: Self-pay | Admitting: Family Medicine

## 2024-05-04 DIAGNOSIS — K219 Gastro-esophageal reflux disease without esophagitis: Secondary | ICD-10-CM

## 2024-05-06 ENCOUNTER — Other Ambulatory Visit

## 2024-05-13 ENCOUNTER — Other Ambulatory Visit

## 2024-07-22 ENCOUNTER — Other Ambulatory Visit

## 2024-07-29 ENCOUNTER — Encounter: Admitting: Family Medicine
